# Patient Record
Sex: Female | Born: 1960 | Race: Black or African American | Hispanic: No | Marital: Single | Smoking: Never smoker
Health system: Southern US, Community
[De-identification: ages and names within clinical notes are randomized; demographics above are authoritative.]

## PROBLEM LIST (undated history)

## (undated) DIAGNOSIS — I10 Essential (primary) hypertension: Secondary | ICD-10-CM

## (undated) DIAGNOSIS — E119 Type 2 diabetes mellitus without complications: Secondary | ICD-10-CM

## (undated) HISTORY — PX: REPLACEMENT TOTAL KNEE: SUR1224

## (undated) HISTORY — PX: TUBAL LIGATION: SHX77

---

## 2016-03-07 ENCOUNTER — Emergency Department (HOSPITAL_COMMUNITY): Payer: Managed Care, Other (non HMO)

## 2016-03-07 ENCOUNTER — Observation Stay (HOSPITAL_COMMUNITY): Payer: Managed Care, Other (non HMO)

## 2016-03-07 ENCOUNTER — Encounter (HOSPITAL_COMMUNITY): Payer: Self-pay

## 2016-03-07 ENCOUNTER — Inpatient Hospital Stay (HOSPITAL_COMMUNITY)
Admission: EM | Admit: 2016-03-07 | Discharge: 2016-03-10 | DRG: 065 | Disposition: A | Discharge status: Home / Self Care | Payer: Managed Care, Other (non HMO) | Attending: Internal Medicine | Admitting: Internal Medicine

## 2016-03-07 DIAGNOSIS — R29703 NIHSS score 3: Secondary | ICD-10-CM | POA: Diagnosis present

## 2016-03-07 DIAGNOSIS — I10 Essential (primary) hypertension: Secondary | ICD-10-CM | POA: Diagnosis not present

## 2016-03-07 DIAGNOSIS — D333 Benign neoplasm of cranial nerves: Secondary | ICD-10-CM | POA: Diagnosis present

## 2016-03-07 DIAGNOSIS — H538 Other visual disturbances: Secondary | ICD-10-CM | POA: Diagnosis present

## 2016-03-07 DIAGNOSIS — E785 Hyperlipidemia, unspecified: Secondary | ICD-10-CM | POA: Diagnosis present

## 2016-03-07 DIAGNOSIS — Z96653 Presence of artificial knee joint, bilateral: Secondary | ICD-10-CM | POA: Diagnosis present

## 2016-03-07 DIAGNOSIS — E119 Type 2 diabetes mellitus without complications: Secondary | ICD-10-CM | POA: Diagnosis present

## 2016-03-07 DIAGNOSIS — R740 Nonspecific elevation of levels of transaminase and lactic acid dehydrogenase [LDH]: Secondary | ICD-10-CM

## 2016-03-07 DIAGNOSIS — I639 Cerebral infarction, unspecified: Secondary | ICD-10-CM | POA: Diagnosis present

## 2016-03-07 DIAGNOSIS — I63432 Cerebral infarction due to embolism of left posterior cerebral artery: Secondary | ICD-10-CM | POA: Diagnosis not present

## 2016-03-07 DIAGNOSIS — Q211 Atrial septal defect: Secondary | ICD-10-CM

## 2016-03-07 DIAGNOSIS — Z79899 Other long term (current) drug therapy: Secondary | ICD-10-CM

## 2016-03-07 DIAGNOSIS — R202 Paresthesia of skin: Secondary | ICD-10-CM | POA: Diagnosis not present

## 2016-03-07 DIAGNOSIS — Z6841 Body Mass Index (BMI) 40.0 and over, adult: Secondary | ICD-10-CM

## 2016-03-07 DIAGNOSIS — Z880 Allergy status to penicillin: Secondary | ICD-10-CM

## 2016-03-07 DIAGNOSIS — R7401 Elevation of levels of liver transaminase levels: Secondary | ICD-10-CM | POA: Diagnosis present

## 2016-03-07 DIAGNOSIS — Z9884 Bariatric surgery status: Secondary | ICD-10-CM

## 2016-03-07 DIAGNOSIS — Z885 Allergy status to narcotic agent status: Secondary | ICD-10-CM

## 2016-03-07 DIAGNOSIS — R4781 Slurred speech: Secondary | ICD-10-CM | POA: Diagnosis present

## 2016-03-07 DIAGNOSIS — R200 Anesthesia of skin: Secondary | ICD-10-CM | POA: Diagnosis present

## 2016-03-07 HISTORY — DX: Type 2 diabetes mellitus without complications: E11.9

## 2016-03-07 HISTORY — DX: Essential (primary) hypertension: I10

## 2016-03-07 LAB — DIFFERENTIAL
BASOS PCT: 0 %
Basophils Absolute: 0 10*3/uL (ref 0.0–0.1)
EOS ABS: 0.1 10*3/uL (ref 0.0–0.7)
EOS PCT: 1 %
Lymphocytes Relative: 18 %
Lymphs Abs: 1.4 10*3/uL (ref 0.7–4.0)
MONO ABS: 0.2 10*3/uL (ref 0.1–1.0)
MONOS PCT: 3 %
Neutro Abs: 6.1 10*3/uL (ref 1.7–7.7)
Neutrophils Relative %: 78 %

## 2016-03-07 LAB — RAPID URINE DRUG SCREEN, HOSP PERFORMED
Amphetamines: NOT DETECTED
BARBITURATES: NOT DETECTED
BENZODIAZEPINES: NOT DETECTED
Cocaine: NOT DETECTED
Opiates: NOT DETECTED
Tetrahydrocannabinol: NOT DETECTED

## 2016-03-07 LAB — URINALYSIS, ROUTINE W REFLEX MICROSCOPIC
Bilirubin Urine: NEGATIVE
Glucose, UA: >1000 mg/dL — AB
HGB URINE DIPSTICK: NEGATIVE
Ketones, ur: NEGATIVE mg/dL
Nitrite: NEGATIVE
PROTEIN: NEGATIVE mg/dL
Specific Gravity, Urine: 1.032 — ABNORMAL HIGH (ref 1.005–1.030)
pH: 5 (ref 5.0–8.0)

## 2016-03-07 LAB — I-STAT CHEM 8, ED
BUN: 12 mg/dL (ref 6–20)
CALCIUM ION: 1.19 mmol/L (ref 1.13–1.30)
CHLORIDE: 103 mmol/L (ref 101–111)
Creatinine, Ser: 1 mg/dL (ref 0.44–1.00)
GLUCOSE: 145 mg/dL — AB (ref 65–99)
HCT: 41 % (ref 36.0–46.0)
HEMOGLOBIN: 13.9 g/dL (ref 12.0–15.0)
POTASSIUM: 4.1 mmol/L (ref 3.5–5.1)
SODIUM: 143 mmol/L (ref 135–145)
TCO2: 26 mmol/L (ref 0–100)

## 2016-03-07 LAB — CBG MONITORING, ED: Glucose-Capillary: 169 mg/dL — ABNORMAL HIGH (ref 65–99)

## 2016-03-07 LAB — CBC
HEMATOCRIT: 39.5 % (ref 36.0–46.0)
Hemoglobin: 12 g/dL (ref 12.0–15.0)
MCH: 26.8 pg (ref 26.0–34.0)
MCHC: 30.4 g/dL (ref 30.0–36.0)
MCV: 88.2 fL (ref 78.0–100.0)
PLATELETS: 341 10*3/uL (ref 150–400)
RBC: 4.48 MIL/uL (ref 3.87–5.11)
RDW: 14.3 % (ref 11.5–15.5)
WBC: 7.8 10*3/uL (ref 4.0–10.5)

## 2016-03-07 LAB — COMPREHENSIVE METABOLIC PANEL
ALT: 56 U/L — ABNORMAL HIGH (ref 14–54)
AST: 51 U/L — ABNORMAL HIGH (ref 15–41)
Albumin: 3.7 g/dL (ref 3.5–5.0)
Alkaline Phosphatase: 75 U/L (ref 38–126)
Anion gap: 8 (ref 5–15)
BUN: 11 mg/dL (ref 6–20)
CO2: 27 mmol/L (ref 22–32)
Calcium: 9.5 mg/dL (ref 8.9–10.3)
Chloride: 106 mmol/L (ref 101–111)
Creatinine, Ser: 1.05 mg/dL — ABNORMAL HIGH (ref 0.44–1.00)
GFR calc Af Amer: >60 mL/min (ref >60)
GFR calc non Af Amer: 58 mL/min — ABNORMAL LOW (ref >60)
Glucose, Bld: 149 mg/dL — ABNORMAL HIGH (ref 65–99)
Potassium: 4 mmol/L (ref 3.5–5.1)
Sodium: 141 mmol/L (ref 135–145)
Total Bilirubin: 1 mg/dL (ref 0.3–1.2)
Total Protein: 6.6 g/dL (ref 6.5–8.1)

## 2016-03-07 LAB — PROTIME-INR
INR: 1.12 (ref 0.00–1.49)
PROTHROMBIN TIME: 14.6 s (ref 11.6–15.2)

## 2016-03-07 LAB — URINE MICROSCOPIC-ADD ON
Bacteria, UA: NONE SEEN
RBC / HPF: NONE SEEN RBC/hpf (ref 0–5)

## 2016-03-07 LAB — I-STAT TROPONIN, ED: Troponin i, poc: 0 ng/mL (ref 0.00–0.08)

## 2016-03-07 LAB — ETHANOL

## 2016-03-07 LAB — APTT: aPTT: 24 seconds (ref 24–37)

## 2016-03-07 MED ORDER — INSULIN ASPART 100 UNIT/ML ~~LOC~~ SOLN
0.0000 [IU] | Freq: Three times a day (TID) | SUBCUTANEOUS | Status: DC
  Administered 2016-03-08: 3 [IU] via SUBCUTANEOUS
  Administered 2016-03-08: 2 [IU] via SUBCUTANEOUS
  Administered 2016-03-08: 3 [IU] via SUBCUTANEOUS
  Administered 2016-03-09 – 2016-03-10 (×2): 2 [IU] via SUBCUTANEOUS
  Administered 2016-03-10: 3 [IU] via SUBCUTANEOUS

## 2016-03-07 MED ORDER — STROKE: EARLY STAGES OF RECOVERY BOOK
Freq: Once | Status: AC
  Administered 2016-03-08: 1
  Filled 2016-03-07: qty 1

## 2016-03-07 MED ORDER — VITAMIN B-12 1000 MCG PO TABS
1000.0000 ug | ORAL_TABLET | Freq: Every day | ORAL | Status: DC
  Administered 2016-03-08 – 2016-03-10 (×3): 1000 ug via ORAL
  Filled 2016-03-07 (×3): qty 1

## 2016-03-07 MED ORDER — VITAMIN A & D 8000-400 UNITS PO CAPS: 1.0000 | ORAL_CAPSULE | Freq: Every day | ORAL | Status: DC

## 2016-03-07 MED ORDER — ASPIRIN 325 MG PO TABS
325.0000 mg | ORAL_TABLET | Freq: Every day | ORAL | Status: DC
  Administered 2016-03-08 – 2016-03-10 (×3): 325 mg via ORAL
  Filled 2016-03-07 (×3): qty 1

## 2016-03-07 MED ORDER — ASPIRIN 300 MG RE SUPP
300.0000 mg | Freq: Every day | RECTAL | Status: DC
Start: 2016-03-08 — End: 2016-03-10

## 2016-03-07 MED ORDER — SENNOSIDES-DOCUSATE SODIUM 8.6-50 MG PO TABS: 1.0000 | ORAL_TABLET | Freq: Every evening | ORAL | Status: DC | PRN

## 2016-03-07 MED ORDER — ENOXAPARIN SODIUM 40 MG/0.4ML ~~LOC~~ SOLN
40.0000 mg | Freq: Every day | SUBCUTANEOUS | Status: DC
  Administered 2016-03-08 – 2016-03-10 (×2): 40 mg via SUBCUTANEOUS
  Filled 2016-03-07 (×2): qty 0.4

## 2016-03-07 MED ORDER — ADULT MULTIVITAMIN W/MINERALS CH
1.0000 | ORAL_TABLET | Freq: Every day | ORAL | Status: DC
  Administered 2016-03-08 – 2016-03-10 (×3): 1 via ORAL
  Filled 2016-03-07 (×3): qty 1

## 2016-03-07 MED ORDER — SODIUM CHLORIDE 0.9 % IV SOLN
INTRAVENOUS | Status: DC
  Administered 2016-03-08: 1000 mL via INTRAVENOUS
  Administered 2016-03-09: 500 mL via INTRAVENOUS

## 2016-03-07 MED ORDER — CALCIUM CARBONATE 1250 (500 CA) MG PO TABS
1250.0000 mg | ORAL_TABLET | Freq: Every day | ORAL | Status: DC
  Administered 2016-03-08 – 2016-03-10 (×3): 1250 mg via ORAL
  Filled 2016-03-07 (×3): qty 1

## 2016-03-07 NOTE — ED Notes (Signed)
Patient is stable A&Ox4.  Ready to be transported to 71[facility] at this time.

## 2016-03-07 NOTE — ED Notes (Signed)
Patient drove from [place] overnight for a funeral this afternoon.  Started feeling numbness and tingling around end of funeral. Unknown time.  Patient states that she has some blurred vision to the right side.  Denies any falling but cannot remember times.  Patient states she is diabetic and took her medications and has not eaten all day.  Patient A&Ox4

## 2016-03-07 NOTE — ED Provider Notes (Signed)
CSN: [id number]     Arrival date & time 03/07/16  1728 History   First MD Initiated Contact with Patient 03/07/16 1738     Chief Complaint  Patient presents with  . Numbness    right sided     (Consider location/radiation/quality/duration/timing/severity/associated sxs/prior Treatment) HPI  Pt presenting with c/o blurry vision, slurred speech, tingling of the right side of her face.  Family also state she seemed slow in her responses.  No headache.  She states symptoms started at 2:30pm when she was at a funeral. No weakness of arms or legs.  She states the symptoms have resolved at this time, vision is better, speech back to normal.  She drove last night from [place]- did not get very much sleep, attended funeral today.  Her blood sugar was checked during the symptoms and was 140s.  EMS called and patient brought to the ED for further evaluation and management.  There are no other associated systemic symptoms, there are no other alleviating or modifying factors.   Past Medical History  Diagnosis Date  . Diabetes mellitus without complication ([place])   . Hypertension    History reviewed. No pertinent past surgical history. History reviewed. No pertinent family history. Social History  Substance Use Topics  . Smoking status: Never Smoker   . Smokeless tobacco: None  . Alcohol Use: Yes   OB History    No data available     Review of Systems  ROS reviewed and all otherwise negative except for mentioned in HPI    Allergies  Codeine; Penicillins; and Percocet  Home Medications   Prior to Admission medications   Medication Sig Start Date End Date Taking? Authorizing Provider  CALCIUM PO Take 1 tablet by mouth daily.   Yes Historical Provider, MD  Multiple Vitamins-Minerals (MULTIVITAMIN ADULTS 50+) TABS Take 1 tablet by mouth daily.   Yes Historical Provider, MD  vitamin B-12 (CYANOCOBALAMIN) 1000 MCG tablet Take 1,000 mcg by mouth daily.   Yes Historical Provider, MD   Vitamins A & D (VITAMIN A & D PO) Take 1 tablet by mouth daily.   Yes Historical Provider, MD   BP 125/78 mmHg  Pulse 81  Temp(Src) 98.9 F (37.2 C)  Resp 19  SpO2 96%  Vitals reviewed Physical Exam  Physical Examination: General appearance - alert, well appearing, and in no distress Mental status - alert, oriented to person, place, and time Eyes - pupils equal and reactive, extraocular eye movements intact Mouth - mucous membranes moist, pharynx normal without lesions Neck - supple, no significant adenopathy Chest - clear to auscultation, no wheezes, rales or rhonchi, symmetric air entry Heart - normal rate, regular rhythm, normal S1, S2, no murmurs, rubs, clicks or gallops Abdomen - soft, nontender, nondistended, no masses or organomegaly Neurological - alert, oriented x 3, cranial nerves 2-12 tested and intact, strength 5/5 in extremities x 4, sensation intact Extremities - peripheral pulses normal, no pedal edema, no clubbing or cyanosis Skin - normal coloration and turgor, no rashes  ED Course  Procedures (including critical care time) Labs Review Labs Reviewed  COMPREHENSIVE METABOLIC PANEL - Abnormal; Notable for the following:    Glucose, Bld 149 (*)    Creatinine, Ser 1.05 (*)    AST 51 (*)    ALT 56 (*)    GFR calc non Af Amer 58 (*)    All other components within normal limits  URINALYSIS, ROUTINE W REFLEX MICROSCOPIC (NOT AT [facility]) - Abnormal; Notable for the following:  Specific Gravity, Urine 1.032 (*)    Glucose, UA >1000 (*)    Leukocytes, UA SMALL (*)    All other components within normal limits  URINE MICROSCOPIC-ADD ON - Abnormal; Notable for the following:    Squamous Epithelial / LPF 0-5 (*)    All other components within normal limits  CBG MONITORING, ED - Abnormal; Notable for the following:    Glucose-Capillary 169 (*)    All other components within normal limits  I-STAT CHEM 8, ED - Abnormal; Notable for the following:    Glucose, Bld 145 (*)     All other components within normal limits  ETHANOL  PROTIME-INR  APTT  CBC  DIFFERENTIAL  URINE RAPID DRUG SCREEN, HOSP PERFORMED  HEMOGLOBIN A1C  LIPID PANEL  I-STAT TROPOININ, ED    Imaging Review No results found. I have personally reviewed and evaluated these images and lab results as part of my medical decision-making.   EKG Interpretation   Date/Time:  Saturday March 07 2016 17:34:17 EDT Ventricular Rate:  84 PR Interval:    QRS Duration: 92 QT Interval:  381 QTC Calculation: 451 R Axis:     Text Interpretation:  Sinus rhythm Low voltage, precordial leads Abnormal  R-wave progression, early transition Borderline T abnormalities, diffuse  leads No old tracing to compare Confirmed by [facility]  MD, Aibhlinn Kalmar [phone]) on  03/07/2016 5:42:39 PM      MDM   Final diagnoses:  Cerebrovascular accident (CVA), unspecified mechanism ([place])    Pt presenting with c/o changes in vision, slurred speech- symptoms have resolved at time of ED evaluation so no indication for TPA.  Blood sugar was reassuring.  D/w radiology - concern for possible left occipital ischemia- d/w neurology as noted below.  Pt admitted to [facility], neurology consult.    9:04 PM d/w neurology- they have reviewed CT scan, will consult.  Recommend medical admission and stroke workup.  10:00 PM d/w Dr. Netta Neat for admission to [facility], pt will go to telemetry, obs.     Alfonzo Beers, MD 03/07/16 [phone]

## 2016-03-07 NOTE — H&P (Signed)
History and Physical    Amy Joseph [id number] DOB: 01-07-1961 DOA: 03/07/2016  PCP: Pcp Not In System   Patient coming from: Home   Chief Complaint: Right face numbness, slurred speech, blurred vision   HPI: Amy Joseph is a 55 y.o. female with medical history significant for hypertension, diabetes mellitus, and obesity who presents the emergency department for evaluation of right-sided facial paresthesias, slurred speech, and blurred vision involving the right eye. Patient reports that she drove to [place] from the [place] area overnight last night to attend a funeral here today and did not get much sleep at all. She reports being in her usual state of health when attending the funeral, but towards the end, around 2:30 PM, she noted numbness and tingling in the right side of her face and blurred vision involving the right eye. Her family was present at the bedside and notes that there was some slurred speech and her responses seem to be slowed during this episode. EMS was activated for transport to the hospital and all deficits had results completely by time of her arrival. Patient notes that she has had mild headaches for the past month or so which she hasn't really thought much of, and is also experienced hearing deficit in her right ear for the past month which she had initially attributed to earwax, but persisted following removal of the cerumen. She reports being scheduled for outpatient MRI back in [place] in order to evaluate her right-sided hearing deficits further. She denies any recent fevers, chills, dyspnea, or cough. She also denies chest pain, palpitations, or leg swelling. She denies abdominal pain, nausea, vomiting, or diarrhea. She denies any significant alcohol use and denies use of illicit substances. There's been no recent changes in her medications. She has never experienced similar symptoms previously.  ED Course: Upon arrival to the ED, patient is found to be  afebrile, saturating in the 90s on room air, and with vital signs otherwise stable. EKG demonstrates a sinus rhythm with low voltage QRS and early R transition. CMP features a mild elevation in the transaminases and CBC is unremarkable. Ethanol level is undetectable, UDS is completely negative, troponin is undetectable, INR is within the normal limits at 1.12, and urinalysis features greater than 1000 glucose and elevated specific gravity. Noncontrast CT of the head was obtained, and there has been no read entered into the EMR, but the radiologist has indicated to the ED physician that there is a concern for possible ischemia involving the left occipital lobe. Neurology was consulted by the ED physician and advises. The patient into the hospital for ongoing evaluation and management of suspected acute ischemic CVA.  Review of Systems:  All other systems reviewed and apart from HPI, are negative.  Past Medical History  Diagnosis Date  . Diabetes mellitus without complication ([place])   . Hypertension     History reviewed. No pertinent past surgical history.   reports that she has never smoked. She does not have any smokeless tobacco history on file. She reports that she drinks alcohol. Her drug history is not on file.  Allergies  Allergen Reactions  . Codeine Itching and Rash  . Penicillins Itching and Rash    Has patient had a PCN reaction causing immediate rash, facial/tongue/throat swelling, SOB or lightheadedness with hypotension: Yes Has patient had a PCN reaction causing severe rash involving mucus membranes or skin necrosis: No Has patient had a PCN reaction that required hospitalization No Has patient had a PCN reaction occurring within  the last 10 years: No If all of the above answers are "NO", then may proceed with Cephalosporin use.   Marland Kitchen Percocet [Oxycodone-Acetaminophen] Itching and Rash    History reviewed. No pertinent family history.   Prior to Admission medications     Medication Sig Start Date End Date Taking? Authorizing Provider  CALCIUM PO Take 1 tablet by mouth daily.   Yes Historical Provider, MD  Multiple Vitamins-Minerals (MULTIVITAMIN ADULTS 50+) TABS Take 1 tablet by mouth daily.   Yes Historical Provider, MD  vitamin B-12 (CYANOCOBALAMIN) 1000 MCG tablet Take 1,000 mcg by mouth daily.   Yes Historical Provider, MD  Vitamins A & D (VITAMIN A & D PO) Take 1 tablet by mouth daily.   Yes Historical Provider, MD    Physical Exam: Filed Vitals:   03/07/16 1931 03/07/16 1945 03/07/16 2000 03/07/16 2208  BP:  138/81 136/71 119/96  Pulse:  82 81 72  Temp: 98.9 F (37.2 C)     Resp:  14 19   SpO2:  100% 99% 98%      Constitutional: NAD, calm, comfortable, obese  Eyes: PERTLA, lids and conjunctivae normal ENMT: Mucous membranes are moist. Posterior pharynx clear of any exudate or lesions.   Neck: normal, supple, no masses, no thyromegaly Respiratory: clear to auscultation bilaterally, no wheezing, no crackles. Normal respiratory effort.   Cardiovascular: S1 & S2 heard, regular rate and rhythm, no significant murmurs. No carotid bruits. No significant JVD. Abdomen: No distension, no tenderness, no masses palpated. Bowel sounds normal.  Musculoskeletal: no clubbing / cyanosis. No joint deformity upper and lower extremities. Normal muscle tone.  Skin: no significant rashes, lesions, ulcers. Warm, dry, well-perfused. Neurologic: CN 2-12 grossly intact. Sensation intact, DTR normal. Strength 5/5 in all 4 limbs.  Psychiatric: Normal judgment and insight. Alert and oriented x 3. A little odd in her perseveration over diet orders and apparent apathy towards suspected stroke.     Labs on Admission: I have personally reviewed following labs and imaging studies  CBC:  Recent Labs Lab 03/07/16 1815 03/07/16 1838  WBC 7.8  --   NEUTROABS 6.1  --   HGB 12.0 13.9  HCT 39.5 41.0  MCV 88.2  --   PLT 341  --    Basic Metabolic Panel:  Recent  Labs Lab 03/07/16 1815 03/07/16 1838  NA 141 143  K 4.0 4.1  CL 106 103  CO2 27  --   GLUCOSE 149* 145*  BUN 11 12  CREATININE 1.05* 1.00  CALCIUM 9.5  --    GFR: CrCl cannot be calculated (Unknown ideal weight.). Liver Function Tests:  Recent Labs Lab 03/07/16 1815  AST 51*  ALT 56*  ALKPHOS 75  BILITOT 1.0  PROT 6.6  ALBUMIN 3.7   No results for input(s): LIPASE, AMYLASE in the last 168 hours. No results for input(s): AMMONIA in the last 168 hours. Coagulation Profile:  Recent Labs Lab 03/07/16 1815  INR 1.12   Cardiac Enzymes: No results for input(s): CKTOTAL, CKMB, CKMBINDEX, TROPONINI in the last 168 hours. BNP (last 3 results) No results for input(s): PROBNP in the last 8760 hours. HbA1C: No results for input(s): HGBA1C in the last 72 hours. CBG:  Recent Labs Lab 03/07/16 1738  GLUCAP 169*   Lipid Profile: No results for input(s): CHOL, HDL, LDLCALC, TRIG, CHOLHDL, LDLDIRECT in the last 72 hours. Thyroid Function Tests: No results for input(s): TSH, T4TOTAL, FREET4, T3FREE, THYROIDAB in the last 72 hours. Anemia Panel: No results  for input(s): VITAMINB12, FOLATE, FERRITIN, TIBC, IRON, RETICCTPCT in the last 72 hours. Urine analysis:    Component Value Date/Time   COLORURINE YELLOW 03/07/2016 1815   APPEARANCEUR CLEAR 03/07/2016 1815   LABSPEC 1.032* 03/07/2016 1815   PHURINE 5.0 03/07/2016 1815   GLUCOSEU >1000* 03/07/2016 1815   HGBUR NEGATIVE 03/07/2016 1815   BILIRUBINUR NEGATIVE 03/07/2016 1815   KETONESUR NEGATIVE 03/07/2016 1815   PROTEINUR NEGATIVE 03/07/2016 1815   NITRITE NEGATIVE 03/07/2016 1815   LEUKOCYTESUR SMALL* 03/07/2016 1815   Sepsis Labs: @LABRCNTIP (procalcitonin:4,lacticidven:4) )No results found for this or any previous visit (from the past 240 hour(s)).   Radiological Exams on Admission: No results found.  EKG: Independently reviewed. Sinus rhythm, low-voltage QRS, early R transition  Assessment/Plan  1.  Right face paresthesia, blurred vision right eye, ?acute ischemic CVA  - Sxs started ~2:30 pm and resolved spontaneously by time of admission  - CT head does not have read in EMR, but radiologist has indicated there is concern for ischemia in left occipital lobe  - Neurology consulting and much appreciated  - No tPA as sxs have apparently resolved  - MRI/MRA brain, carotid dopplers, TTE ordered and pending  - Monitor on telemetry; check fasting lipids and A1c in the am; - NPO until passes bedside swallow screen  - PT, OT, SLP evals deferred given lack of residual symptoms  - Secondary ppx with ASA  - VTE ppx  - Control RF's; maintain normothermia, euglycemia, euvolemia  - Permissive HTN in acute phase  2. Hypertension  - At goal currently  - Has documented hx of such, but not on any medications  - Monitor, allow HTN to 220/110 in acute phase    3. Type II DM  - No A1c on file - >1k urine glucose on admission, serum glucose 150  - Check CBG with meals and qHS - Start with moderate-intensity SSI correctional, adjust prn   - A1c ordered and pending   4. 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

## 2016-03-08 ENCOUNTER — Observation Stay (HOSPITAL_COMMUNITY): Payer: Managed Care, Other (non HMO)

## 2016-03-08 DIAGNOSIS — I63432 Cerebral infarction due to embolism of left posterior cerebral artery: Secondary | ICD-10-CM | POA: Diagnosis present

## 2016-03-08 DIAGNOSIS — I10 Essential (primary) hypertension: Secondary | ICD-10-CM | POA: Diagnosis present

## 2016-03-08 DIAGNOSIS — I6310 Cerebral infarction due to embolism of unspecified precerebral artery: Secondary | ICD-10-CM

## 2016-03-08 DIAGNOSIS — E119 Type 2 diabetes mellitus without complications: Secondary | ICD-10-CM | POA: Diagnosis present

## 2016-03-08 DIAGNOSIS — Z96653 Presence of artificial knee joint, bilateral: Secondary | ICD-10-CM | POA: Diagnosis present

## 2016-03-08 DIAGNOSIS — Z885 Allergy status to narcotic agent status: Secondary | ICD-10-CM | POA: Diagnosis not present

## 2016-03-08 DIAGNOSIS — Z9884 Bariatric surgery status: Secondary | ICD-10-CM | POA: Diagnosis not present

## 2016-03-08 DIAGNOSIS — R4781 Slurred speech: Secondary | ICD-10-CM | POA: Diagnosis present

## 2016-03-08 DIAGNOSIS — R202 Paresthesia of skin: Secondary | ICD-10-CM | POA: Diagnosis not present

## 2016-03-08 DIAGNOSIS — Z6841 Body Mass Index (BMI) 40.0 and over, adult: Secondary | ICD-10-CM | POA: Diagnosis not present

## 2016-03-08 DIAGNOSIS — R29703 NIHSS score 3: Secondary | ICD-10-CM | POA: Diagnosis present

## 2016-03-08 DIAGNOSIS — H538 Other visual disturbances: Secondary | ICD-10-CM | POA: Diagnosis present

## 2016-03-08 DIAGNOSIS — I63332 Cerebral infarction due to thrombosis of left posterior cerebral artery: Secondary | ICD-10-CM | POA: Diagnosis not present

## 2016-03-08 DIAGNOSIS — Z79899 Other long term (current) drug therapy: Secondary | ICD-10-CM | POA: Diagnosis not present

## 2016-03-08 DIAGNOSIS — I639 Cerebral infarction, unspecified: Secondary | ICD-10-CM | POA: Diagnosis present

## 2016-03-08 DIAGNOSIS — Z880 Allergy status to penicillin: Secondary | ICD-10-CM | POA: Diagnosis not present

## 2016-03-08 DIAGNOSIS — Q211 Atrial septal defect: Secondary | ICD-10-CM | POA: Diagnosis not present

## 2016-03-08 DIAGNOSIS — E785 Hyperlipidemia, unspecified: Secondary | ICD-10-CM | POA: Diagnosis present

## 2016-03-08 DIAGNOSIS — I340 Nonrheumatic mitral (valve) insufficiency: Secondary | ICD-10-CM | POA: Diagnosis not present

## 2016-03-08 DIAGNOSIS — D333 Benign neoplasm of cranial nerves: Secondary | ICD-10-CM | POA: Diagnosis present

## 2016-03-08 DIAGNOSIS — I6789 Other cerebrovascular disease: Secondary | ICD-10-CM | POA: Diagnosis not present

## 2016-03-08 LAB — GLUCOSE, CAPILLARY
GLUCOSE-CAPILLARY: 138 mg/dL — AB (ref 65–99)
GLUCOSE-CAPILLARY: 182 mg/dL — AB (ref 65–99)
Glucose-Capillary: 181 mg/dL — ABNORMAL HIGH (ref 65–99)

## 2016-03-08 LAB — LIPID PANEL
Cholesterol: 134 mg/dL (ref 0–200)
HDL: 43 mg/dL (ref >40)
LDL CALC: 76 mg/dL (ref 0–99)
Total CHOL/HDL Ratio: 3.1 RATIO
Triglycerides: 77 mg/dL (ref <150)
VLDL: 15 mg/dL (ref 0–40)

## 2016-03-08 LAB — ANTITHROMBIN III: AntiThromb III Func: 112 % (ref 75–120)

## 2016-03-08 MED ORDER — ATORVASTATIN CALCIUM 10 MG PO TABS
20.0000 mg | ORAL_TABLET | Freq: Every day | ORAL | Status: DC
  Administered 2016-03-08 – 2016-03-09 (×2): 20 mg via ORAL
  Filled 2016-03-08 (×2): qty 2

## 2016-03-08 MED ORDER — ACETAMINOPHEN 325 MG PO TABS
650.0000 mg | ORAL_TABLET | Freq: Four times a day (QID) | ORAL | Status: DC | PRN
  Administered 2016-03-08: 650 mg via ORAL
  Filled 2016-03-08: qty 2

## 2016-03-08 NOTE — Progress Notes (Signed)
VASCULAR LAB PRELIMINARY  PRELIMINARY  PRELIMINARY  PRELIMINARY  Carotid duplex completed.    Preliminary report:  1-39% ICA plaquing.  Vertebral artery flow is antegrade.   Klea Nall, RVT 03/08/2016, 9:29 AM

## 2016-03-08 NOTE — Consult Note (Signed)
Neurology Consult Note  Reason for Consultation: Possible stroke on CT  Requesting provider: Alfonzo Beers, MD  CC: vision changes, slurred speech  HPI: This is a 89 right-handed woman who presents to the muscular emergency department for evaluation after developing vision changes, slurred speech, and tingling in the right face. The symptoms started approximately 1430 on the afternoon of 03/07/16. She was attending a funeral when she first noticed symptoms. She describes feeling as if she was seeing 2 images offset by one another by just a little bit. She also feels as if the vision in her right eye was blurry. At the same time, she felt that she had some tingling on the right side of her face. Family members are present at the bedside and they noted that she seemed confused with inability to recall her name or age. The patient denies any frank vision loss, weakness, or headache associated with these symptoms. She has no history of previous similar symptoms. She states that her symptoms largely resolved prior to the arrival in the emergency department this evening, though she still states that she feels "off."  She attributes her symptoms to fatigue. She states that she drove to [place] from [place], [place] after she got off work yesterday evening. As a result, she got very little sleep. She endorses some low-grade headaches that are nonspecific in nature. 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  PMH:  1. Diabetes mellitus 2. Hypertension 3. History of Chiari malformation 4. History of morbid obesity  PSH:  1. Bilateral knee replacement surgery  2. Chiari decompression 3. Gastric bypass with revision  Family history: She denies any major health problems in the family.  Social history:  Social History   Social History  . Marital status: Single    Spouse name: N/A   . Number of children: N/A  . Years of education: N/A   Occupational History  . Not on file.   Social History Main Topics  . Smoking status: Never Smoker  . Smokeless tobacco: Not on file  . Alcohol use Yes  . Drug use: Unknown  . Sexual activity: Not Currently   Other Topics Concern  . Not on file   Social History Narrative  . No narrative on file    Current outpatient meds: Current Meds  Medication Sig  . CALCIUM PO Take 1 tablet by mouth daily.  . Multiple Vitamins-Minerals (MULTIVITAMIN ADULTS 50+) TABS Take 1 tablet by mouth daily.  . vitamin B-12 (CYANOCOBALAMIN) 1000 MCG tablet Take 1,000 mcg by mouth daily.  . Vitamins A & D (VITAMIN A & D PO) Take 1 tablet by mouth daily.    Current inpatient meds:  Current Facility-Administered Medications  Medication Dose Route Frequency Provider Last Rate Last Dose  . 0.9 %  sodium chloride infusion   Intravenous Continuous Ilene Qua Opyd, MD 10 mL/hr at 03/08/16 0006 1,000 mL at 03/08/16 0006  . aspirin suppository 300 mg  300 mg Rectal Daily Vianne Bulls, MD       Or  . aspirin tablet 325 mg  325 mg Oral Daily Ilene Qua Opyd, MD      . calcium carbonate (OS-CAL - dosed in mg of elemental calcium) tablet 1,250 mg  1,250 mg Oral Q breakfast Timothy S Opyd, MD      . enoxaparin (LOVENOX) injection 40 mg  40 mg  Subcutaneous Daily Timothy S Opyd, MD      . insulin aspart (novoLOG) injection 0-15 Units  0-15 Units Subcutaneous TID WC Ilene Qua Opyd, MD      . multivitamin with minerals tablet 1 tablet  1 tablet Oral Daily Timothy S Opyd, MD      . senna-docusate (Senokot-S) tablet 1 tablet  1 tablet Oral QHS PRN Vianne Bulls, MD      . vitamin B-12 (CYANOCOBALAMIN) tablet 1,000 mcg  1,000 mcg Oral Daily Vianne Bulls, MD        Allergies: Allergies  Allergen Reactions  . Codeine Itching and Rash  . Penicillins Itching and Rash    Has patient had a PCN reaction causing immediate rash, facial/tongue/throat swelling, SOB or  lightheadedness with hypotension: Yes Has patient had a PCN reaction causing severe rash involving mucus membranes or skin necrosis: No Has patient had a PCN reaction that required hospitalization No Has patient had a PCN reaction occurring within the last 10 years: No If all of the above answers are "NO", then may proceed with Cephalosporin use.   Marland Kitchen Percocet [Oxycodone-Acetaminophen] Itching and Rash    ROS: As per HPI. A full 14-point review of systems was performed and is otherwise unremarkable.   PE:  BP 124/67 (BP Location: Left Arm)   Pulse 69   Temp 98.5 F (36.9 C) (Oral)   Resp 15   Wt 131.2 kg (289 lb 4.8 oz)   SpO2 98%   General: WD obese-American woman resting comfortably on the ED gurney. She is in no acute distress. AAO x4. Speech clear, no dysarthria. No aphasia. Follows commands briskly. Affect is flat and she seems indifferent to the fact that she is currently in the emergency department. Comportment is normal.  HEENT: Normocephalic. Neck supple without LAD. MMM, OP clear. Dentition good. Sclerae anicteric. No conjunctival injection.  CV: Regular, no murmur. Carotid pulses full and symmetric, no bruits. Distal pulses 2+ and symmetric.  Lungs: CTAB.  Abdomen: Soft, non-distended, non-tender. Bowel sounds present x4.  Extremities: No C/C/E. Neuro:  CN: Pupils are equal and round. They are symmetrically reactive from 3-->2 mm. EOMI without nystagmus. No reported diplopia. Facial sensation is intact to light touch. Face is symmetric at rest with normal strength and mobility. Hearing is intact to conversational voice. Palate elevates symmetrically and uvula is midline. Voice is normal in tone, pitch and quality. Bilateral SCM and trapezii are 5/5. Tongue is midline with normal bulk and mobility.  Motor: Normal bulk, tone, and strength. No tremor or other abnormal movements. No drift.  Sensation: Intact to light touch, pinprick, vibration. DTRs: 2+, symmetric. Toes downgoing  bilaterally. No pathologic reflexes.  Coordination: Finger-to-nose and heel-to-shin are without dysmetria. Finger taps are normal in amplitude and speed, no decrement.   Labs:  Lab Results  Component Value Date   WBC 7.8 03/07/2016   HGB 13.9 03/07/2016   HCT 41.0 03/07/2016   PLT 341 03/07/2016   GLUCOSE 145 (H) 03/07/2016   ALT 56 (H) 03/07/2016   AST 51 (H) 03/07/2016   NA 143 03/07/2016   K 4.1 03/07/2016   CL 103 03/07/2016   CREATININE 1.00 03/07/2016   BUN 12 03/07/2016   CO2 27 03/07/2016   INR 1.12 03/07/2016   Urinalysis notable for specific gravity 1.032, greater than 1000 mg/dL glucose, small leukocyte esterase, negative nitrites Urine drug screen negative Serum ethanol less than 5 mg/dL Troponin 0.00  Imaging: I have personally and independently reviewed  the CT scan of the head without contrast from 03/07/16. This shows patchy hypodensity in the left occipital region concerning for possible ischemic change, the age of which is indeterminate. There also appears to be some hyperdensity within the region of the left posterior cerebral artery.  Assessment and Plan:  1. Possible ischemic stroke: CT scan of the head is concerning for possible left occipital ischemic infarction. Her complaints are somewhat vague with reports of blurry vision in the right eye. She certainly has no hemianopsia on current examination but it's possible that the symptoms that she is referring to as blurry vision may have represented a field cut instead has individuals often have difficulty describing visual field abnormalities. She certainly has risk factors for cerebrovascular disease including diabetes, hypertension, hyperlipidemia, and obesity. She tells me that she does not take aspirin on a regular basis. At this point, I recommend stroke evaluation with MRI scan of the brain, MRA of the head, transthoracic echocardiogram, carotid Dopplers, hemoglobin A1c, and lipid panel. She should be initiated on  aspirin therapy with 81 mg daily. She would also benefit from initiation of a statin, typically atorvastatin 80 mg daily or its equivalent. Ensure tight control of her blood pressure, temperature, and glucose.  2. Slurred speech: This appears to have resolved and she has no evidence of speech abnormalities on her current examination. This can be followed.  3. Changes in vision: She alternately describes blurry vision and what sounds like maybe double vision. As noted above, I cannot exclude that she had a field cut particularly given the presence of abnormalities in the left occipital lobe on imaging. Examination is unrevealing at this time.  This was discussed with the patient and her family members at the bedside. They are in agreement with the plan as noted. There were given the opportunity to ask any questions which were addressed to their satisfaction.  The stroke team will follow up beginning with morning rounds on 7/to 3/17. Please feel free to call with any questions or concerns.

## 2016-03-08 NOTE — Progress Notes (Signed)
Patient arrived around 2330 alert and oriented no pain needing MRI, and lab work as well as ECHO SCD's on and Q 2 neuro checks and vitals started. Will continue to monitor.

## 2016-03-08 NOTE — Progress Notes (Signed)
Pt admitted after midnight, please see earlier admission note by Dr. Myna Hidalgo. Pt seen and examined at bedside, stable, blood work, VS and imaging studies reviewed with pt. Pt with new ? Embolic strokes. Stroke work up in progress. Neurology following.   Faye Ramsay, MD  [facility] Pager [phone]  If 7PM-7AM, please contact night-coverage [email or web address] Password [email or web address]

## 2016-03-08 NOTE — Progress Notes (Signed)
STROKE TEAM PROGRESS NOTE   HISTORY OF PRESENT ILLNESS (per record) This is a 23 right-handed woman who presents to the [facility] emergency department for evaluation after developing vision changes, slurred speech, and tingling in the right face. The symptoms started approximately 1430 on the afternoon of 03/07/16. She was attending a funeral when she first noticed symptoms. She describes feeling as if she was seeing 2 images offset by one another by just a little bit. She also feels as if the vision in her right eye was blurry. At the same time, she felt that she had some tingling on the right side of her face. Family members are present at the bedside and they noted that she seemed confused with inability to recall her name or age. The patient denies any frank vision loss, weakness, or headache associated with these symptoms. She has no history of previous similar symptoms. She states that her symptoms largely resolved prior to the arrival in the emergency department this evening, though she still states that she feels "off."  She attributes her symptoms to fatigue. She states that she drove to [place] from [place], [place] after she got off work yesterday evening. As a result, she got very little sleep. She endorses some low-grade headaches that are nonspecific in nature. 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   SUBJECTIVE (INTERVAL HISTORY) Currently she denies any numbness or visual deficits   OBJECTIVE Temp:  [97.7 F (36.5 C)-98.9 F (37.2 C)] 97.9 F (36.6 C) (07/23 0730) Pulse Rate:  [64-87] 67 (07/23 0730) Cardiac Rhythm: Normal sinus rhythm (07/23 0002) Resp:  [12-24] 16 (07/23 0730) BP: (104-152)/(55-97) 104/55 (07/23 0730) SpO2:  [95 %-100 %] 95 % (07/23 0730) Weight:  [131.2 kg (289 lb 4.8 oz)] 131.2 kg (289 lb 4.8 oz) (07/22 2330)  CBC:  Recent Labs Lab  03/07/16 1815 03/07/16 1838  WBC 7.8  --   NEUTROABS 6.1  --   HGB 12.0 13.9  HCT 39.5 41.0  MCV 88.2  --   PLT 341  --     Basic Metabolic Panel:  Recent Labs Lab 03/07/16 1815 03/07/16 1838  NA 141 143  K 4.0 4.1  CL 106 103  CO2 27  --   GLUCOSE 149* 145*  BUN 11 12  CREATININE 1.05* 1.00  CALCIUM 9.5  --     Lipid Panel:    Component Value Date/Time   CHOL 134 03/08/2016 0421   TRIG 77 03/08/2016 0421   HDL 43 03/08/2016 0421   CHOLHDL 3.1 03/08/2016 0421   VLDL 15 03/08/2016 0421   LDLCALC 76 03/08/2016 0421   HgbA1c: No results found for: HGBA1C Urine Drug Screen:    Component Value Date/Time   LABOPIA NONE DETECTED 03/07/2016 1815   COCAINSCRNUR NONE DETECTED 03/07/2016 1815   LABBENZ NONE DETECTED 03/07/2016 1815   AMPHETMU NONE DETECTED 03/07/2016 1815   THCU NONE DETECTED 03/07/2016 1815   LABBARB NONE DETECTED 03/07/2016 1815      IMAGING  Dg Chest 2 View Result Date: 03/07/2016 No acute finding.  Mild low volumes.   Ct Head Wo Contrast Result Date: 03/07/2016 High attenuation within the expected location of the left PCA artery, raising the possibility of thrombus. Additionally there is suggestion of possible early ischemic change within the left occipital lobe.     Mr Jodene Nam Head/brain [id number] Cm Result  Date: 03/08/2016  MRI HEAD:  Acute patchy LEFT posterior cerebral artery territory infarct (LEFT hippocampus and LEFT thalamus). Sub cm RIGHT cerebellar and LEFT occipital lobe acute infarcts. Status post suboccipital decompression for Chiari 1 malformation.   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      PHYSICAL EXAM  Neuro: Mental Status: Patient is awake, alert, oriented to person, place, month, year, and situation. Patient is able to give a clear and coherent history. No signs of aphasia or neglect Cranial Nerves: II: Visual Fields are full. Pupils are equal, round, and reactive to light.   III,IV,  VI: EOMI without ptosis or diploplia.  V: Facial sensation is symmetric to temperature VII: Facial movement is symmetric.  VIII: hearing is intact to voice X: Uvula elevates symmetrically XI: Shoulder shrug is symmetric. XII: tongue is midline without atrophy or fasciculations.  Motor: Tone is normal. Bulk is normal. 5/5 strength was present in all four extremities.  Sensory: Sensation is symmetric to light touch and temperature in the arms and legs. Deep Tendon Reflexes: 2+ and symmetric in the biceps and patellae.  Plantars: Toes are downgoing bilaterally.  Cerebellar: FNF and HKS are intact bilaterally         ASSESSMENT/PLAN Ms. Amy Joseph is a 55 y.o. female with history of hypertension, diabetes mellitus, and right sided acoustic neuroma presenting with transient visual difficulties, slurred speech, and tingling of the right face. She did not receive IV t-PA due to resolution of deficits.  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  Resultant  resolution of deficits  MRI - Acute patchy LEFT posterior cerebral artery territory infarct. Sub cm RIGHT cerebellar and LEFT  occipital lobe acute infarcts. Status post suboccipital decompression for Chiari 1 malformation  MRA - emergent LEFT posterior cerebral artery occlusion at P2 segment.    Carotid Doppler -  1-39% ICA plaquing.  Vertebral artery flow is antegrade.   2D Echo - pending  LDL - 76  HgbA1c pending  VTE prophylaxis - Lovenox  Diet Carb Modified Fluid consistency:: Thin; Room service appropriate?: Yes  No antithrombotic prior to admission, now on aspirin 325 mg daily  Patient counseled to be compliant with her antithrombotic medications  Ongoing aggressive stroke risk factor management  Therapy recommendations: Pending  Disposition: Pending  Hypertension  Stable  Permissive hypertension (OK if < 220/120) but gradually normalize in 5-7 days  Long-term BP goal  normotensive  Hyperlipidemia  Home meds:  No lipid lowering medications prior to admission  LDL 76, goal < 70  Now on Lipitor 20 mg daily  Continue statin at discharge  Diabetes  HgbA1c pending, goal < 7.0  Uncontrolled  Other Stroke Risk Factors  ETOH use, advised to drink no more than 1 - 2 drink(s) a day  Obesity, Body mass index is 51.25 kg/m., recommend weight loss, diet and exercise as appropriate    Other Active Problems   PLAN  Will schedule for TEE and possible loop on Monday  Z. Robyne Askew MD Neurology     To contact Stroke Continuity provider, please refer to [email or web address] After hours, contact General Neurology

## 2016-03-09 ENCOUNTER — Encounter (HOSPITAL_COMMUNITY)
Admission: EM | Disposition: A | Discharge status: Home / Self Care | Payer: Self-pay | Source: Home / Self Care | Attending: Internal Medicine

## 2016-03-09 ENCOUNTER — Inpatient Hospital Stay (HOSPITAL_COMMUNITY): Payer: Managed Care, Other (non HMO)

## 2016-03-09 ENCOUNTER — Encounter (HOSPITAL_COMMUNITY): Payer: Self-pay | Admitting: *Deleted

## 2016-03-09 DIAGNOSIS — I6789 Other cerebrovascular disease: Secondary | ICD-10-CM

## 2016-03-09 DIAGNOSIS — I340 Nonrheumatic mitral (valve) insufficiency: Secondary | ICD-10-CM

## 2016-03-09 DIAGNOSIS — I639 Cerebral infarction, unspecified: Secondary | ICD-10-CM

## 2016-03-09 DIAGNOSIS — I63332 Cerebral infarction due to thrombosis of left posterior cerebral artery: Secondary | ICD-10-CM

## 2016-03-09 HISTORY — PX: TEE WITHOUT CARDIOVERSION: SHX5443

## 2016-03-09 LAB — BASIC METABOLIC PANEL
ANION GAP: 6 (ref 5–15)
BUN: 10 mg/dL (ref 6–20)
CHLORIDE: 107 mmol/L (ref 101–111)
CO2: 28 mmol/L (ref 22–32)
Calcium: 9 mg/dL (ref 8.9–10.3)
Creatinine, Ser: 0.86 mg/dL (ref 0.44–1.00)
GFR calc Af Amer: >60 mL/min (ref >60)
GLUCOSE: 125 mg/dL — AB (ref 65–99)
POTASSIUM: 3.4 mmol/L — AB (ref 3.5–5.1)
Sodium: 141 mmol/L (ref 135–145)

## 2016-03-09 LAB — GLUCOSE, CAPILLARY
GLUCOSE-CAPILLARY: 149 mg/dL — AB (ref 65–99)
GLUCOSE-CAPILLARY: 133 mg/dL — AB (ref 65–99)
GLUCOSE-CAPILLARY: 211 mg/dL — AB (ref 65–99)
Glucose-Capillary: 137 mg/dL — ABNORMAL HIGH (ref 65–99)

## 2016-03-09 LAB — CARDIOLIPIN ANTIBODIES, IGG, IGM, IGA
Anticardiolipin IgA: <9 APL U/mL (ref 0–11)
Anticardiolipin IgM: <9 MPL U/mL (ref 0–12)

## 2016-03-09 LAB — CBC
HEMATOCRIT: 36.9 % (ref 36.0–46.0)
HEMOGLOBIN: 11.3 g/dL — AB (ref 12.0–15.0)
MCH: 26.9 pg (ref 26.0–34.0)
MCHC: 30.6 g/dL (ref 30.0–36.0)
MCV: 87.9 fL (ref 78.0–100.0)
PLATELETS: 309 10*3/uL (ref 150–400)
RBC: 4.2 MIL/uL (ref 3.87–5.11)
RDW: 14.6 % (ref 11.5–15.5)
WBC: 5.5 10*3/uL (ref 4.0–10.5)

## 2016-03-09 LAB — HOMOCYSTEINE: Homocysteine: 8.4 umol/L (ref 0.0–15.0)

## 2016-03-09 LAB — ECHOCARDIOGRAM COMPLETE
Height: 63 in
Weight: 4628.8 oz

## 2016-03-09 LAB — HEMOGLOBIN A1C
HEMOGLOBIN A1C: 7.3 % — AB (ref 4.8–5.6)
MEAN PLASMA GLUCOSE: 163 mg/dL

## 2016-03-09 SURGERY — ECHOCARDIOGRAM, TRANSESOPHAGEAL
Anesthesia: Moderate Sedation

## 2016-03-09 MED ORDER — SODIUM CHLORIDE 0.9 % IV SOLN: INTRAVENOUS | Status: DC

## 2016-03-09 MED ORDER — MIDAZOLAM HCL 10 MG/2ML IJ SOLN
INTRAMUSCULAR | Status: DC | PRN
  Administered 2016-03-09: 1 mg via INTRAVENOUS
  Administered 2016-03-09 (×2): 2 mg via INTRAVENOUS

## 2016-03-09 MED ORDER — FENTANYL CITRATE (PF) 100 MCG/2ML IJ SOLN
INTRAMUSCULAR | Status: AC
  Filled 2016-03-09: qty 2

## 2016-03-09 MED ORDER — LIDOCAINE VISCOUS 2 % MT SOLN
OROMUCOSAL | Status: AC
  Filled 2016-03-09: qty 15

## 2016-03-09 MED ORDER — MIDAZOLAM HCL 5 MG/ML IJ SOLN
INTRAMUSCULAR | Status: AC
  Filled 2016-03-09: qty 2

## 2016-03-09 MED ORDER — LIDOCAINE VISCOUS 2 % MT SOLN
OROMUCOSAL | Status: DC | PRN
  Administered 2016-03-09: 1 via OROMUCOSAL

## 2016-03-09 MED ORDER — DIPHENHYDRAMINE HCL 50 MG/ML IJ SOLN
INTRAMUSCULAR | Status: AC
  Filled 2016-03-09: qty 1

## 2016-03-09 MED ORDER — FENTANYL CITRATE (PF) 100 MCG/2ML IJ SOLN
INTRAMUSCULAR | Status: DC | PRN
  Administered 2016-03-09: 12.5 ug via INTRAVENOUS
  Administered 2016-03-09 (×2): 25 ug via INTRAVENOUS

## 2016-03-09 NOTE — Progress Notes (Signed)
  Echocardiogram Echocardiogram Transesophageal has been performed.  Amy Joseph 03/09/2016, 3:24 PM

## 2016-03-09 NOTE — Consult Note (Signed)
ELECTROPHYSIOLOGY CONSULT NOTE  Patient ID: Amy Joseph MRN: [id number], DOB/AGE: 1961/03/17   Admit date: 03/07/2016 Date of Consult: 03/09/2016  Primary Physician: Pcp Not In System Reason for Consultation: Cryptogenic stroke; recommendations regarding Implantable Loop Recorder  History of Present Illness Amy Joseph was admitted on 03/07/2016 with acute CVA. she has been monitored on telemetry which has demonstrated no arrhythmias. No cause has been identified. Inpatient stroke work-up is to be completed with a TEE. EP has been asked to evaluate for placement of an implantable loop recorder to monitor for atrial fibrillation.  Past Medical History Past Medical History:  Diagnosis Date  . Diabetes mellitus without complication ([place])   . Hypertension     Past Surgical History History reviewed. No pertinent surgical history.  Allergies/Intolerances Allergies  Allergen Reactions  . Codeine Itching and Rash  . Penicillins Itching and Rash    Has patient had a PCN reaction causing immediate rash, facial/tongue/throat swelling, SOB or lightheadedness with hypotension: Yes Has patient had a PCN reaction causing severe rash involving mucus membranes or skin necrosis: No Has patient had a PCN reaction that required hospitalization No Has patient had a PCN reaction occurring within the last 10 years: No If all of the above answers are "NO", then may proceed with Cephalosporin use.   Marland Kitchen Percocet [Oxycodone-Acetaminophen] Itching and Rash   Inpatient Medications . aspirin  300 mg Rectal Daily   Or  . aspirin  325 mg Oral Daily  . atorvastatin  20 mg Oral q1800  . calcium carbonate  1,250 mg Oral Q breakfast  . enoxaparin (LOVENOX) injection  40 mg Subcutaneous Daily  . insulin aspart  0-15 Units Subcutaneous TID WC  . multivitamin with minerals  1 tablet Oral Daily  . vitamin B-12  1,000 mcg Oral Daily   . sodium chloride Stopped (03/09/16 0000)   Social History Social  History   Social History  . Marital status: Single    Spouse name: N/A  . Number of children: N/A  . Years of education: N/A   Occupational History  . Not on file.   Social History Main Topics  . Smoking status: Never Smoker  . Smokeless tobacco: Not on file  . Alcohol use Yes  . Drug use: Unknown  . Sexual activity: Not Currently   Other Topics Concern  . Not on file   Social History Narrative  . No narrative on file    Review of Systems General: No chills, fever, night sweats or weight changes  Cardiovascular:  No chest pain, dyspnea on exertion, edema, orthopnea, palpitations, paroxysmal nocturnal dyspnea Dermatological: No rash, lesions or masses Respiratory: No cough, dyspnea Urologic: No hematuria, dysuria Abdominal: No nausea, vomiting, diarrhea, bright red blood per rectum, melena, or hematemesis Neurologic: No visual changes, weakness, changes in mental status All other systems reviewed and are otherwise negative except as noted above.  Physical Exam Blood pressure 125/63, pulse 71, temperature 97.9 F (36.6 C), temperature source Oral, resp. rate 16, height 5\' 3"  (1.6 m), weight 289 lb 4.8 oz (131.2 kg), SpO2 96 %.  General: Well developed, well appearing 55 y.o. female in no acute distress. HEENT: Normocephalic, atraumatic. EOMs intact. Sclera nonicteric. Oropharynx clear.  Neck: Supple without bruits. No JVD. Lungs: Respirations regular and unlabored, CTA bilaterally. No wheezes, rales or rhonchi. Heart: RRR. S1, S2 present. No murmurs, rub, S3 or S4. Abdomen: Soft, non-tender, non-distended. BS present x 4 quadrants. No hepatosplenomegaly.  Extremities: No clubbing, cyanosis or edema. DP/PT/Radials 2+  and equal bilaterally. Psych: Normal affect. Neuro: Alert and oriented X 3. Moves all extremities spontaneously. Musculoskeletal: No kyphosis. Skin: Intact. Warm and dry. No rashes or petechiae in exposed areas.   Labs Lab Results  Component Value Date    WBC 5.5 03/09/2016   HGB 11.3 (L) 03/09/2016   HCT 36.9 03/09/2016   MCV 87.9 03/09/2016   PLT 309 03/09/2016    Recent Labs Lab 03/07/16 1815  03/09/16 0310  NA 141  < > 141  K 4.0  < > 3.4*  CL 106  < > 107  CO2 27  --  28  BUN 11  < > 10  CREATININE 1.05*  < > 0.86  CALCIUM 9.5  --  9.0  PROT 6.6  --   --   BILITOT 1.0  --   --   ALKPHOS 75  --   --   ALT 56*  --   --   AST 51*  --   --   GLUCOSE 149*  < > 125*  < > = values in this interval not displayed.  Recent Labs  03/07/16 1815  INR 1.12    Radiology/Studies Dg Chest 2 View  Result Date: 03/07/2016 CLINICAL DATA:  CVA.  Blurred vision. EXAM: CHEST  2 VIEW COMPARISON:  None. FINDINGS: Increased density over the spine in the lateral projection is likely from bulky spondylosis. Low volume chest with interstitial crowding but no convincing pneumonia. No edema or effusion. Normal heart size and mediastinal contours. IMPRESSION: No acute finding.  Mild low volumes. Electronically Signed   By: Monte Fantasia M.D.   On: 03/07/2016 23:12   Ct Head Wo Contrast  Result Date: 03/07/2016 CLINICAL DATA:  Patient with numbness and tingling after long drive. Blurred vision. EXAM: CT HEAD WITHOUT CONTRAST TECHNIQUE: Contiguous axial images were obtained from the base of the skull through the vertex without intravenous contrast. COMPARISON:  None. FINDINGS: Ventricles and sulci are appropriate for patient's age. There is linear high attenuation within the expected location of the left PCA artery (image 12; series 2). Suggestion of possible hypodensity within the left occipital lobe raising the possibility of early ischemic change. No evidence for intracranial hemorrhage, mass or mass effect. Orbits are unremarkable. Paranasal sinuses are well aerated. Mastoid air cells are unremarkable. Suboccipital craniectomy. IMPRESSION: High attenuation within the expected location of the left PCA artery, raising the possibility of thrombus.  Additionally there is suggestion of possible early ischemic change within the left occipital lobe. These results were called by telephone at the time of interpretation on 03/07/2016 at 8:57 pm to Dr. Alfonzo Beers , who verbally acknowledged these results. Electronically Signed   By: Lovey Newcomer M.D.   On: 03/07/2016 21:00  Mr Brain Wo Contrast  Result Date: 03/08/2016 CLINICAL DATA:  Numbness and tingling after long drive, blurry vision. Follow-up possible stroke. EXAM: MRI HEAD WITHOUT CONTRAST MRA HEAD WITHOUT CONTRAST TECHNIQUE: Multiplanar, multiecho pulse sequences of the brain and surrounding structures were obtained without intravenous contrast. Angiographic images of the head were obtained using MRA technique without contrast. COMPARISON:  CT HEAD March 07, 2016 at 2033 hours FINDINGS: MRI HEAD FINDINGS INTRACRANIAL CONTENTS: Reduced diffusion LEFT hippocampus, LEFT thalamus with corresponding low ADC values. Subcentimeter foci of reduced diffusion RIGHT cerebellum, LEFT occipital lobe. No susceptibility artifact to suggest hemorrhage. The ventricles and sulci are normal for patient's age. Scattered subcentimeter supratentorial white matter FLAIR T2 hyperintensities exclusive aforementioned abnormality compatible with mild chronic small vessel ischemic  disease. No suspicious parenchymal signal, masses or mass effect. No abnormal extra-axial fluid collections. No extra-axial masses though, contrast enhanced sequences would be more sensitive. 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 ORBITS: The included ocular globes and orbital contents are non-suspicious. SINUSES: The mastoid air-cells and included paranasal sinuses are well-aerated. SKULL/SOFT TISSUES: No abnormal sellar expansion. No suspicious calvarial bone marrow signal. Craniocervical junction maintained. Status post suboccipital decompressive craniectomy. MRA HEAD FINDINGS- Mild motion  degraded examination. ANTERIOR CIRCULATION: Normal flow related enhancement of the included cervical, petrous, cavernous and supraclinoid internal carotid arteries. Patent anterior communicating artery. Normal flow related enhancement of the anterior and middle cerebral arteries, including distal segments. No large vessel occlusion, high-grade stenosis, abnormal luminal irregularity, aneurysm. POSTERIOR CIRCULATION: LEFT vertebral artery is dominant. Basilar artery is patent, with normal flow related enhancement of the main branch vessels. Tiny LEFT posterior communicating artery, occluded LEFT proximal P2 segment with minimal collateralization by MRI. No large vessel occlusion, high-grade stenosis, abnormal luminal irregularity, aneurysm. IMPRESSION: MRI HEAD: Acute patchy LEFT posterior cerebral artery territory infarct (LEFT hippocampus and LEFT thalamus). Sub cm RIGHT cerebellar and LEFT occipital lobe acute infarcts. Status post suboccipital decompression for Chiari 1 malformation. 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 These results will be called to the ordering clinician or representative by the Radiologist Assistant, and communication documented in the PACS or zVision Dashboard. Electronically Signed   By: Elon Alas M.D.   On: 03/08/2016 05:21  Mr Jodene Nam Head/brain [id number] Cm  Result Date: 03/08/2016 CLINICAL DATA:  Numbness and tingling after long drive, blurry vision. Follow-up possible stroke. EXAM: MRI HEAD WITHOUT CONTRAST MRA HEAD WITHOUT CONTRAST TECHNIQUE: Multiplanar, multiecho pulse sequences of the brain and surrounding structures were obtained without intravenous contrast. Angiographic images of the head were obtained using MRA technique without contrast. COMPARISON:  CT HEAD March 07, 2016 at 2033 hours FINDINGS: MRI HEAD FINDINGS INTRACRANIAL CONTENTS: Reduced diffusion LEFT hippocampus, LEFT thalamus with corresponding low ADC values. Subcentimeter foci  of reduced diffusion RIGHT cerebellum, LEFT occipital lobe. No susceptibility artifact to suggest hemorrhage. The ventricles and sulci are normal for patient's age. Scattered subcentimeter supratentorial white matter FLAIR T2 hyperintensities exclusive aforementioned abnormality compatible with mild chronic small vessel ischemic disease. No suspicious parenchymal signal, masses or mass effect. No abnormal extra-axial fluid collections. No extra-axial masses though, contrast enhanced sequences would be more sensitive. 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 ORBITS: The included ocular globes and orbital contents are non-suspicious. SINUSES: The mastoid air-cells and included paranasal sinuses are well-aerated. SKULL/SOFT TISSUES: No abnormal sellar expansion. No suspicious calvarial bone marrow signal. Craniocervical junction maintained. Status post suboccipital decompressive craniectomy. MRA HEAD FINDINGS- Mild motion degraded examination. ANTERIOR CIRCULATION: Normal flow related enhancement of the included cervical, petrous, cavernous and supraclinoid internal carotid arteries. Patent anterior communicating artery. Normal flow related enhancement of the anterior and middle cerebral arteries, including distal segments. No large vessel occlusion, high-grade stenosis, abnormal luminal irregularity, aneurysm. POSTERIOR CIRCULATION: LEFT vertebral artery is dominant. Basilar artery is patent, with normal flow related enhancement of the main branch vessels. Tiny LEFT posterior communicating artery, occluded LEFT proximal P2 segment with minimal collateralization by MRI. No large vessel occlusion, high-grade stenosis, abnormal luminal irregularity, aneurysm. IMPRESSION: MRI HEAD: Acute patchy LEFT posterior cerebral artery territory infarct (LEFT hippocampus and LEFT thalamus). Sub cm RIGHT cerebellar and LEFT occipital lobe acute infarcts. Status post  suboccipital  decompression for Chiari 1 malformation. 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 These results will be called to the ordering clinician or representative by the Radiologist Assistant, and communication documented in the PACS or zVision Dashboard. Electronically Signed   By: Elon Alas M.D.   On: 03/08/2016 05:21   Echocardiogram  pending  12-lead ECG sinus rhythm Telemetry sinus rhythm, no evidence of atrial fibrillation   Assessment and Plan 1. Cryptogenic stroke: Patient presented with cryptogenic stroke with some symptoms that have been improving since hospitalization. She says that she feels well currently. She is planning to have a TEE later today. She does live in [place], and therefore following up on her Linq would be difficult. Would therefore recommend her to follow-up with her primary physician to discuss further monitoring for atrial fibrillation. I did discuss with her the options of the Linq implantation, which she understands that might be a good plan of care for her. She understands that she will need to talk to her primary physician about this.  Signed, Will Meredith Leeds 03/09/2016, 10:08 AM

## 2016-03-09 NOTE — Progress Notes (Signed)
Patient ID: Amy Joseph, female   DOB: 06-07-1961, 55 y.o.   MRN: [id number]    PROGRESS NOTE    Lougene Kalajian  [id number] DOB: 1961/01/29 DOA: 03/07/2016  PCP: Pcp Not In System   Brief Narrative:  Admitted for stroke work up.   Assessment & Plan:    Left PCA, right cerebellar and left occipital infarcts, embolic secondary to unknown source but paradoxical embolism likely given presence of PFO and stroke happening after a long car ride.  MRI  Patchy left PCA, right cerebellar and left occipital infarcts  MRA  Left P2 occlusion  Carotid Doppler  No significant stenosis   2D Echo  EF 50-55% with no source of embolus  TEE positive for large PFO  Check LE dopplers to rule out VTE - pt refused   LDL 76  HgbA1c 7.3  Aspirin advised  Hypertension, essential   Stable  Hyperlipidemia  Home meds:  No statin  LDL 76, goal < 70  Added lipitor 20 mg this hospitalization  Continue statin at discharge  Diabetes type II with no specific complications   123456 7.3, goal < 7.0  Morbid Obesity,  - Body mass index is 51.25 kg/m., recommend weight loss, diet and exercise    Other Active Problems  History of Chiari malformation with decompression   DVT prophylaxis: SCD's, Lovenox Sq Code Status: Full  Family Communication: Patient at bedside  Disposition Plan: Home in AM  Consultants:   Neurology  Antimicrobials:   None  Subjective: No concerns today.  Objective: Vitals:   03/09/16 1445 03/09/16 1451 03/09/16 1500 03/09/16 1652  BP: 119/63 135/60 123/69 112/83  Pulse: 84  80 76  Resp: 19 18 16 16   Temp:  97.8 F (36.6 C)  98.1 F (36.7 C)  TempSrc:  Oral  Oral  SpO2: 97% 96% 96% 98%  Weight:      Height:        Intake/Output Summary (Last 24 hours) at 03/09/16 1842 Last data filed at 03/09/16 0000  Gross per 24 hour  Intake              240 ml  Output                0 ml  Net              240 ml   Filed Weights   03/07/16 2330   Weight: 131.2 kg (289 lb 4.8 oz)    Examination:  General exam: Appears calm and comfortable  Respiratory system: Clear to auscultation. Respiratory effort normal. Cardiovascular system: S1 & S2 heard, RRR. No JVD, murmurs, rubs, gallops or clicks.  Gastrointestinal system: Abdomen is nondistended, soft and nontender.  Central nervous system: Alert and oriented. No focal neurological deficits.  Data Reviewed: I have personally reviewed following labs and imaging studies  CBC:  Recent Labs Lab 03/07/16 1815 03/07/16 1838 03/09/16 0310  WBC 7.8  --  5.5  NEUTROABS 6.1  --   --   HGB 12.0 13.9 11.3*  HCT 39.5 41.0 36.9  MCV 88.2  --  87.9  PLT 341  --  [id number]   Basic Metabolic Panel:  Recent Labs Lab 03/07/16 1815 03/07/16 1838 03/09/16 0310  NA 141 143 141  K 4.0 4.1 3.4*  CL 106 103 107  CO2 27  --  28  GLUCOSE 149* 145* 125*  BUN 11 12 10   CREATININE 1.05* 1.00 0.86  CALCIUM 9.5  --  9.0  GFR: Estimated Creatinine Clearance: 96.7 mL/min (by C-G formula based on SCr of 0.86 mg/dL). Liver Function Tests:  Recent Labs Lab 03/07/16 1815  AST 51*  ALT 56*  ALKPHOS 75  BILITOT 1.0  PROT 6.6  ALBUMIN 3.7   Coagulation Profile:  Recent Labs Lab 03/07/16 1815  INR 1.12   HbA1C:  Recent Labs  03/08/16 0421  HGBA1C 7.3*   CBG:  Recent Labs Lab 03/08/16 1146 03/08/16 1648 03/09/16 0631 03/09/16 1353 03/09/16 1649  GLUCAP 181* 182* 149* 133* 211*   Lipid Profile:  Recent Labs  03/08/16 0421  CHOL 134  HDL 43  LDLCALC 76  TRIG 77  CHOLHDL 3.1   Urine analysis:    Component Value Date/Time   COLORURINE YELLOW 03/07/2016 1815   APPEARANCEUR CLEAR 03/07/2016 1815   LABSPEC 1.032 (H) 03/07/2016 1815   PHURINE 5.0 03/07/2016 1815   GLUCOSEU >1000 (A) 03/07/2016 1815   HGBUR NEGATIVE 03/07/2016 1815   BILIRUBINUR NEGATIVE 03/07/2016 1815   KETONESUR NEGATIVE 03/07/2016 1815   PROTEINUR NEGATIVE 03/07/2016 1815   NITRITE NEGATIVE  03/07/2016 1815   LEUKOCYTESUR SMALL (A) 03/07/2016 1815   Radiology Studies: Dg Chest 2 View  Result Date: 03/07/2016 CLINICAL DATA:  CVA.  Blurred vision. EXAM: CHEST  2 VIEW COMPARISON:  None. FINDINGS: Increased density over the spine in the lateral projection is likely from bulky spondylosis. Low volume chest with interstitial crowding but no convincing pneumonia. No edema or effusion. Normal heart size and mediastinal contours. IMPRESSION: No acute finding.  Mild low volumes. Electronically Signed   By: Monte Fantasia M.D.   On: 03/07/2016 23:12   Ct Head Wo Contrast  Result Date: 03/07/2016 CLINICAL DATA:  Patient with numbness and tingling after long drive. Blurred vision. EXAM: CT HEAD WITHOUT CONTRAST TECHNIQUE: Contiguous axial images were obtained from the base of the skull through the vertex without intravenous contrast. COMPARISON:  None. FINDINGS: Ventricles and sulci are appropriate for patient's age. There is linear high attenuation within the expected location of the left PCA artery (image 12; series 2). Suggestion of possible hypodensity within the left occipital lobe raising the possibility of early ischemic change. No evidence for intracranial hemorrhage, mass or mass effect. Orbits are unremarkable. Paranasal sinuses are well aerated. Mastoid air cells are unremarkable. Suboccipital craniectomy. IMPRESSION: High attenuation within the expected location of the left PCA artery, raising the possibility of thrombus. Additionally there is suggestion of possible early ischemic change within the left occipital lobe. These results were called by telephone at the time of interpretation on 03/07/2016 at 8:57 pm to Dr. Alfonzo Beers , who verbally acknowledged these results. Electronically Signed   By: Lovey Newcomer M.D.   On: 03/07/2016 21:00  Mr Brain Wo Contrast  Result Date: 03/08/2016 CLINICAL DATA:  Numbness and tingling after long drive, blurry vision. Follow-up possible stroke. EXAM:  MRI HEAD WITHOUT CONTRAST MRA HEAD WITHOUT CONTRAST TECHNIQUE: Multiplanar, multiecho pulse sequences of the brain and surrounding structures were obtained without intravenous contrast. Angiographic images of the head were obtained using MRA technique without contrast. COMPARISON:  CT HEAD March 07, 2016 at 2033 hours FINDINGS: MRI HEAD FINDINGS INTRACRANIAL CONTENTS: Reduced diffusion LEFT hippocampus, LEFT thalamus with corresponding low ADC values. Subcentimeter foci of reduced diffusion RIGHT cerebellum, LEFT occipital lobe. No susceptibility artifact to suggest hemorrhage. The ventricles and sulci are normal for patient's age. Scattered subcentimeter supratentorial white matter FLAIR T2 hyperintensities exclusive aforementioned abnormality compatible with mild chronic small vessel ischemic disease. No  suspicious parenchymal signal, masses or mass effect. No abnormal extra-axial fluid collections. No extra-axial masses though, contrast enhanced sequences would be more sensitive. 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 ORBITS: The included ocular globes and orbital contents are non-suspicious. SINUSES: The mastoid air-cells and included paranasal sinuses are well-aerated. SKULL/SOFT TISSUES: No abnormal sellar expansion. No suspicious calvarial bone marrow signal. Craniocervical junction maintained. Status post suboccipital decompressive craniectomy. MRA HEAD FINDINGS- Mild motion degraded examination. ANTERIOR CIRCULATION: Normal flow related enhancement of the included cervical, petrous, cavernous and supraclinoid internal carotid arteries. Patent anterior communicating artery. Normal flow related enhancement of the anterior and middle cerebral arteries, including distal segments. No large vessel occlusion, high-grade stenosis, abnormal luminal irregularity, aneurysm. POSTERIOR CIRCULATION: LEFT vertebral artery is dominant. Basilar artery is  patent, with normal flow related enhancement of the main branch vessels. Tiny LEFT posterior communicating artery, occluded LEFT proximal P2 segment with minimal collateralization by MRI. No large vessel occlusion, high-grade stenosis, abnormal luminal irregularity, aneurysm. IMPRESSION: MRI HEAD: Acute patchy LEFT posterior cerebral artery territory infarct (LEFT hippocampus and LEFT thalamus). Sub cm RIGHT cerebellar and LEFT occipital lobe acute infarcts. Status post suboccipital decompression for Chiari 1 malformation. 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 These results will be called to the ordering clinician or representative by the Radiologist Assistant, and communication documented in the PACS or zVision Dashboard. Electronically Signed   By: Elon Alas M.D.   On: 03/08/2016 05:21  Mr Jodene Nam Head/brain [id number] Cm  Result Date: 03/08/2016 CLINICAL DATA:  Numbness and tingling after long drive, blurry vision. Follow-up possible stroke. EXAM: MRI HEAD WITHOUT CONTRAST MRA HEAD WITHOUT CONTRAST TECHNIQUE: Multiplanar, multiecho pulse sequences of the brain and surrounding structures were obtained without intravenous contrast. Angiographic images of the head were obtained using MRA technique without contrast. COMPARISON:  CT HEAD March 07, 2016 at 2033 hours FINDINGS: MRI HEAD FINDINGS INTRACRANIAL CONTENTS: Reduced diffusion LEFT hippocampus, LEFT thalamus with corresponding low ADC values. Subcentimeter foci of reduced diffusion RIGHT cerebellum, LEFT occipital lobe. No susceptibility artifact to suggest hemorrhage. The ventricles and sulci are normal for patient's age. Scattered subcentimeter supratentorial white matter FLAIR T2 hyperintensities exclusive aforementioned abnormality compatible with mild chronic small vessel ischemic disease. No suspicious parenchymal signal, masses or mass effect. No abnormal extra-axial fluid collections. No extra-axial masses though,  contrast enhanced sequences would be more sensitive. 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 ORBITS: The included ocular globes and orbital contents are non-suspicious. SINUSES: The mastoid air-cells and included paranasal sinuses are well-aerated. SKULL/SOFT TISSUES: No abnormal sellar expansion. No suspicious calvarial bone marrow signal. Craniocervical junction maintained. Status post suboccipital decompressive craniectomy. MRA HEAD FINDINGS- Mild motion degraded examination. ANTERIOR CIRCULATION: Normal flow related enhancement of the included cervical, petrous, cavernous and supraclinoid internal carotid arteries. Patent anterior communicating artery. Normal flow related enhancement of the anterior and middle cerebral arteries, including distal segments. No large vessel occlusion, high-grade stenosis, abnormal luminal irregularity, aneurysm. POSTERIOR CIRCULATION: LEFT vertebral artery is dominant. Basilar artery is patent, with normal flow related enhancement of the main branch vessels. Tiny LEFT posterior communicating artery, occluded LEFT proximal P2 segment with minimal collateralization by MRI. No large vessel occlusion, high-grade stenosis, abnormal luminal irregularity, aneurysm. IMPRESSION: MRI HEAD: Acute patchy LEFT posterior cerebral artery territory infarct (LEFT hippocampus and LEFT thalamus). Sub cm RIGHT cerebellar and LEFT occipital lobe acute infarcts. Status post suboccipital decompression for Chiari  1 malformation. 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 These results will be called to the ordering clinician or representative by the Radiologist Assistant, and communication documented in the PACS or zVision Dashboard. Electronically Signed   By: Elon Alas M.D.   On: 03/08/2016 05:21     Scheduled Meds: . aspirin  300 mg Rectal Daily   Or  . aspirin  325 mg Oral Daily  .  atorvastatin  20 mg Oral q1800  . calcium carbonate  1,250 mg Oral Q breakfast  . enoxaparin (LOVENOX) injection  40 mg Subcutaneous Daily  . insulin aspart  0-15 Units Subcutaneous TID WC  . multivitamin with minerals  1 tablet Oral Daily  . vitamin B-12  1,000 mcg Oral Daily   Continuous Infusions: . sodium chloride 500 mL (03/09/16 1350)     LOS: 1 day    Time spent: 20 minutes    Faye Ramsay, MD [facility] Pager [phone]  If 7PM-7AM, please contact night-coverage [email or web address] Password [facility] 03/09/2016, 6:42 PM

## 2016-03-09 NOTE — H&P (View-Only) (Signed)
ELECTROPHYSIOLOGY CONSULT NOTE  Patient ID: Amy Joseph MRN: [id number], DOB/AGE: September 25, 1960   Admit date: 03/07/2016 Date of Consult: 03/09/2016  Primary Physician: Pcp Not In System Reason for Consultation: Cryptogenic stroke; recommendations regarding Implantable Loop Recorder  History of Present Illness Amy Joseph was admitted on 03/07/2016 with acute CVA. she has been monitored on telemetry which has demonstrated no arrhythmias. No cause has been identified. Inpatient stroke work-up is to be completed with a TEE. EP has been asked to evaluate for placement of an implantable loop recorder to monitor for atrial fibrillation.  Past Medical History Past Medical History:  Diagnosis Date  . Diabetes mellitus without complication ([place])   . Hypertension     Past Surgical History History reviewed. No pertinent surgical history.  Allergies/Intolerances Allergies  Allergen Reactions  . Codeine Itching and Rash  . Penicillins Itching and Rash    Has patient had a PCN reaction causing immediate rash, facial/tongue/throat swelling, SOB or lightheadedness with hypotension: Yes Has patient had a PCN reaction causing severe rash involving mucus membranes or skin necrosis: No Has patient had a PCN reaction that required hospitalization No Has patient had a PCN reaction occurring within the last 10 years: No If all of the above answers are "NO", then may proceed with Cephalosporin use.   Marland Kitchen Percocet [Oxycodone-Acetaminophen] Itching and Rash   Inpatient Medications . aspirin  300 mg Rectal Daily   Or  . aspirin  325 mg Oral Daily  . atorvastatin  20 mg Oral q1800  . calcium carbonate  1,250 mg Oral Q breakfast  . enoxaparin (LOVENOX) injection  40 mg Subcutaneous Daily  . insulin aspart  0-15 Units Subcutaneous TID WC  . multivitamin with minerals  1 tablet Oral Daily  . vitamin B-12  1,000 mcg Oral Daily   . sodium chloride Stopped (03/09/16 0000)   Social History Social  History   Social History  . Marital status: Single    Spouse name: N/A  . Number of children: N/A  . Years of education: N/A   Occupational History  . Not on file.   Social History Main Topics  . Smoking status: Never Smoker  . Smokeless tobacco: Not on file  . Alcohol use Yes  . Drug use: Unknown  . Sexual activity: Not Currently   Other Topics Concern  . Not on file   Social History Narrative  . No narrative on file    Review of Systems General: No chills, fever, night sweats or weight changes  Cardiovascular:  No chest pain, dyspnea on exertion, edema, orthopnea, palpitations, paroxysmal nocturnal dyspnea Dermatological: No rash, lesions or masses Respiratory: No cough, dyspnea Urologic: No hematuria, dysuria Abdominal: No nausea, vomiting, diarrhea, bright red blood per rectum, melena, or hematemesis Neurologic: No visual changes, weakness, changes in mental status All other systems reviewed and are otherwise negative except as noted above.  Physical Exam Blood pressure 125/63, pulse 71, temperature 97.9 F (36.6 C), temperature source Oral, resp. rate 16, height 5\' 3"  (1.6 m), weight 289 lb 4.8 oz (131.2 kg), SpO2 96 %.  General: Well developed, well appearing 55 y.o. female in no acute distress. HEENT: Normocephalic, atraumatic. EOMs intact. Sclera nonicteric. Oropharynx clear.  Neck: Supple without bruits. No JVD. Lungs: Respirations regular and unlabored, CTA bilaterally. No wheezes, rales or rhonchi. Heart: RRR. S1, S2 present. No murmurs, rub, S3 or S4. Abdomen: Soft, non-tender, non-distended. BS present x 4 quadrants. No hepatosplenomegaly.  Extremities: No clubbing, cyanosis or edema. DP/PT/Radials 2+  and equal bilaterally. Psych: Normal affect. Neuro: Alert and oriented X 3. Moves all extremities spontaneously. Musculoskeletal: No kyphosis. Skin: Intact. Warm and dry. No rashes or petechiae in exposed areas.   Labs Lab Results  Component Value Date    WBC 5.5 03/09/2016   HGB 11.3 (L) 03/09/2016   HCT 36.9 03/09/2016   MCV 87.9 03/09/2016   PLT 309 03/09/2016    Recent Labs Lab 03/07/16 1815  03/09/16 0310  NA 141  < > 141  K 4.0  < > 3.4*  CL 106  < > 107  CO2 27  --  28  BUN 11  < > 10  CREATININE 1.05*  < > 0.86  CALCIUM 9.5  --  9.0  PROT 6.6  --   --   BILITOT 1.0  --   --   ALKPHOS 75  --   --   ALT 56*  --   --   AST 51*  --   --   GLUCOSE 149*  < > 125*  < > = values in this interval not displayed.  Recent Labs  03/07/16 1815  INR 1.12    Radiology/Studies Dg Chest 2 View  Result Date: 03/07/2016 CLINICAL DATA:  CVA.  Blurred vision. EXAM: CHEST  2 VIEW COMPARISON:  None. FINDINGS: Increased density over the spine in the lateral projection is likely from bulky spondylosis. Low volume chest with interstitial crowding but no convincing pneumonia. No edema or effusion. Normal heart size and mediastinal contours. IMPRESSION: No acute finding.  Mild low volumes. Electronically Signed   By: Monte Fantasia M.D.   On: 03/07/2016 23:12   Ct Head Wo Contrast  Result Date: 03/07/2016 CLINICAL DATA:  Patient with numbness and tingling after long drive. Blurred vision. EXAM: CT HEAD WITHOUT CONTRAST TECHNIQUE: Contiguous axial images were obtained from the base of the skull through the vertex without intravenous contrast. COMPARISON:  None. FINDINGS: Ventricles and sulci are appropriate for patient's age. There is linear high attenuation within the expected location of the left PCA artery (image 12; series 2). Suggestion of possible hypodensity within the left occipital lobe raising the possibility of early ischemic change. No evidence for intracranial hemorrhage, mass or mass effect. Orbits are unremarkable. Paranasal sinuses are well aerated. Mastoid air cells are unremarkable. Suboccipital craniectomy. IMPRESSION: High attenuation within the expected location of the left PCA artery, raising the possibility of thrombus.  Additionally there is suggestion of possible early ischemic change within the left occipital lobe. These results were called by telephone at the time of interpretation on 03/07/2016 at 8:57 pm to Dr. Alfonzo Beers , who verbally acknowledged these results. Electronically Signed   By: Lovey Newcomer M.D.   On: 03/07/2016 21:00  Mr Brain Wo Contrast  Result Date: 03/08/2016 CLINICAL DATA:  Numbness and tingling after long drive, blurry vision. Follow-up possible stroke. EXAM: MRI HEAD WITHOUT CONTRAST MRA HEAD WITHOUT CONTRAST TECHNIQUE: Multiplanar, multiecho pulse sequences of the brain and surrounding structures were obtained without intravenous contrast. Angiographic images of the head were obtained using MRA technique without contrast. COMPARISON:  CT HEAD March 07, 2016 at 2033 hours FINDINGS: MRI HEAD FINDINGS INTRACRANIAL CONTENTS: Reduced diffusion LEFT hippocampus, LEFT thalamus with corresponding low ADC values. Subcentimeter foci of reduced diffusion RIGHT cerebellum, LEFT occipital lobe. No susceptibility artifact to suggest hemorrhage. The ventricles and sulci are normal for patient's age. Scattered subcentimeter supratentorial white matter FLAIR T2 hyperintensities exclusive aforementioned abnormality compatible with mild chronic small vessel ischemic  disease. No suspicious parenchymal signal, masses or mass effect. No abnormal extra-axial fluid collections. No extra-axial masses though, contrast enhanced sequences would be more sensitive. 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 ORBITS: The included ocular globes and orbital contents are non-suspicious. SINUSES: The mastoid air-cells and included paranasal sinuses are well-aerated. SKULL/SOFT TISSUES: No abnormal sellar expansion. No suspicious calvarial bone marrow signal. Craniocervical junction maintained. Status post suboccipital decompressive craniectomy. MRA HEAD FINDINGS- Mild motion  degraded examination. ANTERIOR CIRCULATION: Normal flow related enhancement of the included cervical, petrous, cavernous and supraclinoid internal carotid arteries. Patent anterior communicating artery. Normal flow related enhancement of the anterior and middle cerebral arteries, including distal segments. No large vessel occlusion, high-grade stenosis, abnormal luminal irregularity, aneurysm. POSTERIOR CIRCULATION: LEFT vertebral artery is dominant. Basilar artery is patent, with normal flow related enhancement of the main branch vessels. Tiny LEFT posterior communicating artery, occluded LEFT proximal P2 segment with minimal collateralization by MRI. No large vessel occlusion, high-grade stenosis, abnormal luminal irregularity, aneurysm. IMPRESSION: MRI HEAD: Acute patchy LEFT posterior cerebral artery territory infarct (LEFT hippocampus and LEFT thalamus). Sub cm RIGHT cerebellar and LEFT occipital lobe acute infarcts. Status post suboccipital decompression for Chiari 1 malformation. 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 These results My Amy Joseph be called to the ordering clinician or representative by the Radiologist Assistant, and communication documented in the PACS or zVision Dashboard. Electronically Signed   By: Elon Alas M.D.   On: 03/08/2016 05:21  Mr Amy Joseph Head/brain [id number] Cm  Result Date: 03/08/2016 CLINICAL DATA:  Numbness and tingling after long drive, blurry vision. Follow-up possible stroke. EXAM: MRI HEAD WITHOUT CONTRAST MRA HEAD WITHOUT CONTRAST TECHNIQUE: Multiplanar, multiecho pulse sequences of the brain and surrounding structures were obtained without intravenous contrast. Angiographic images of the head were obtained using MRA technique without contrast. COMPARISON:  CT HEAD March 07, 2016 at 2033 hours FINDINGS: MRI HEAD FINDINGS INTRACRANIAL CONTENTS: Reduced diffusion LEFT hippocampus, LEFT thalamus with corresponding low ADC values. Subcentimeter foci  of reduced diffusion RIGHT cerebellum, LEFT occipital lobe. No susceptibility artifact to suggest hemorrhage. The ventricles and sulci are normal for patient's age. Scattered subcentimeter supratentorial white matter FLAIR T2 hyperintensities exclusive aforementioned abnormality compatible with mild chronic small vessel ischemic disease. No suspicious parenchymal signal, masses or mass effect. No abnormal extra-axial fluid collections. No extra-axial masses though, contrast enhanced sequences would be more sensitive. 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 ORBITS: The included ocular globes and orbital contents are non-suspicious. SINUSES: The mastoid air-cells and included paranasal sinuses are well-aerated. SKULL/SOFT TISSUES: No abnormal sellar expansion. No suspicious calvarial bone marrow signal. Craniocervical junction maintained. Status post suboccipital decompressive craniectomy. MRA HEAD FINDINGS- Mild motion degraded examination. ANTERIOR CIRCULATION: Normal flow related enhancement of the included cervical, petrous, cavernous and supraclinoid internal carotid arteries. Patent anterior communicating artery. Normal flow related enhancement of the anterior and middle cerebral arteries, including distal segments. No large vessel occlusion, high-grade stenosis, abnormal luminal irregularity, aneurysm. POSTERIOR CIRCULATION: LEFT vertebral artery is dominant. Basilar artery is patent, with normal flow related enhancement of the main branch vessels. Tiny LEFT posterior communicating artery, occluded LEFT proximal P2 segment with minimal collateralization by MRI. No large vessel occlusion, high-grade stenosis, abnormal luminal irregularity, aneurysm. IMPRESSION: MRI HEAD: Acute patchy LEFT posterior cerebral artery territory infarct (LEFT hippocampus and LEFT thalamus). Sub cm RIGHT cerebellar and LEFT occipital lobe acute infarcts. Status post  suboccipital  decompression for Chiari 1 malformation. 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 These results Amy Joseph be called to the ordering clinician or representative by the Radiologist Assistant, and communication documented in the PACS or zVision Dashboard. Electronically Signed   By: Elon Alas M.D.   On: 03/08/2016 05:21   Echocardiogram  pending  12-lead ECG sinus rhythm Telemetry sinus rhythm, no evidence of atrial fibrillation   Assessment and Plan 1. Cryptogenic stroke: Patient presented with cryptogenic stroke with some symptoms that have been improving since hospitalization. She says that she feels well currently. She is planning to have a TEE later today. She does live in [place], and therefore following up on her Linq would be difficult. Would therefore recommend her to follow-up with her primary physician to discuss further monitoring for atrial fibrillation. I did discuss with her the options of the Linq implantation, which she understands that might be a good plan of care for her. She understands that she Amy Joseph need to talk to her primary physician about this.  Signed, Thaniel Coluccio Meredith Leeds 03/09/2016, 10:08 AM

## 2016-03-09 NOTE — Progress Notes (Signed)
STROKE TEAM PROGRESS NOTE   HISTORY OF PRESENT ILLNESS (per record) [name] who presents to the muscular emergency department for evaluation after developing vision changes, slurred speech, and tingling in the right face. The symptoms started approximately 1430 on the afternoon of 03/07/16. She was attending a funeral when she first noticed symptoms. She describes feeling as if she was seeing 2 images offset by one another by just a little bit. She also feels as if the vision in her right eye was blurry. At the same time, she felt that she had some tingling on the right side of her face. Family members are present at the bedside and they noted that she seemed confused with inability to recall her name or age. The patient denies any frank vision loss, weakness, or headache associated with these symptoms. She has no history of previous similar symptoms. She states that her symptoms largely resolved prior to the arrival in the emergency department this evening, though she still states that she feels "off."  She attributes her symptoms to fatigue. She states that she drove to [place] from [place], [place] after she got off work yesterday evening. As a result, she got very little sleep. She endorses some low-grade headaches that are nonspecific in nature. 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  Patient was not administered IV t-PA secondary to symptoms resolved. She was admitted for further evaluation and treatment.   SUBJECTIVE (INTERVAL HISTORY) Her daughter and close family member are at the bedside.  Overall she feels her condition is stable. Patient visiting her from [place] for a funeral for the "love of her life" - both had moved on but she wanted to attend.    OBJECTIVE Temp:  [97.9 F (36.6 C)-98.7 F (37.1 C)] 97.9 F (36.6 C) (07/24  0205) Pulse Rate:  [64-84] 71 (07/24 0536) Cardiac Rhythm: Normal sinus rhythm (07/24 0700) Resp:  [16-18] 16 (07/24 0536) BP: (125-142)/(62-85) 125/63 (07/24 0536) SpO2:  [92 %-99 %] 96 % (07/24 0536)  CBC:   Recent Labs Lab 03/07/16 1815 03/07/16 1838 03/09/16 0310  WBC 7.8  --  5.5  NEUTROABS 6.1  --   --   HGB 12.0 13.9 11.3*  HCT 39.5 41.0 36.9  MCV 88.2  --  87.9  PLT 341  --  [id number]    Basic Metabolic Panel:   Recent Labs Lab 03/07/16 1815 03/07/16 1838 03/09/16 0310  NA 141 143 141  K 4.0 4.1 3.4*  CL 106 103 107  CO2 27  --  28  GLUCOSE 149* 145* 125*  BUN 11 12 10   CREATININE 1.05* 1.00 0.86  CALCIUM 9.5  --  9.0    Lipid Panel:     Component Value Date/Time   CHOL 134 03/08/2016 0421   TRIG 77 03/08/2016 0421   HDL 43 03/08/2016 0421   CHOLHDL 3.1 03/08/2016 0421   VLDL 15 03/08/2016 0421   LDLCALC 76 03/08/2016 0421   HgbA1c:  Lab Results  Component Value Date   HGBA1C 7.3 (H) 03/08/2016   Urine Drug Screen:     Component Value Date/Time   LABOPIA NONE DETECTED 03/07/2016 1815   COCAINSCRNUR NONE DETECTED 03/07/2016 1815   LABBENZ NONE DETECTED 03/07/2016 1815   AMPHETMU NONE DETECTED 03/07/2016 1815   THCU NONE DETECTED 03/07/2016 1815   LABBARB NONE DETECTED 03/07/2016  [place] Chest 2 View  Result Date: 03/07/2016 CLINICAL DATA:  CVA.  Blurred vision. EXAM: CHEST  2 VIEW COMPARISON:  None. FINDINGS: Increased density over the spine in the lateral projection is likely from bulky spondylosis. Low volume chest with interstitial crowding but no convincing pneumonia. No edema or effusion. Normal heart size and mediastinal contours. IMPRESSION: No acute finding.  Mild low volumes. Electronically Signed   By: Monte Fantasia M.D.   On: 03/07/2016 23:12   Ct Head Wo Contrast  Result Date: 03/07/2016 CLINICAL DATA:  Patient with numbness and tingling after long drive. Blurred vision. EXAM: CT HEAD WITHOUT CONTRAST TECHNIQUE:  Contiguous axial images were obtained from the base of the skull through the vertex without intravenous contrast. COMPARISON:  None. FINDINGS: Ventricles and sulci are appropriate for patient's age. There is linear high attenuation within the expected location of the left PCA artery (image 12; series 2). Suggestion of possible hypodensity within the left occipital lobe raising the possibility of early ischemic change. No evidence for intracranial hemorrhage, mass or mass effect. Orbits are unremarkable. Paranasal sinuses are well aerated. Mastoid air cells are unremarkable. Suboccipital craniectomy. IMPRESSION: High attenuation within the expected location of the left PCA artery, raising the possibility of thrombus. Additionally there is suggestion of possible early ischemic change within the left occipital lobe. These results were called by telephone at the time of interpretation on 03/07/2016 at 8:57 pm to Dr. Alfonzo Beers , who verbally acknowledged these results. Electronically Signed   By: Lovey Newcomer M.D.   On: 03/07/2016 21:00  Mr Brain Wo Contrast  Result Date: 03/08/2016 CLINICAL DATA:  Numbness and tingling after long drive, blurry vision. Follow-up possible stroke. EXAM: MRI HEAD WITHOUT CONTRAST MRA HEAD WITHOUT CONTRAST TECHNIQUE: Multiplanar, multiecho pulse sequences of the brain and surrounding structures were obtained without intravenous contrast. Angiographic images of the head were obtained using MRA technique without contrast. COMPARISON:  CT HEAD March 07, 2016 at 2033 hours FINDINGS: MRI HEAD FINDINGS INTRACRANIAL CONTENTS: Reduced diffusion LEFT hippocampus, LEFT thalamus with corresponding low ADC values. Subcentimeter foci of reduced diffusion RIGHT cerebellum, LEFT occipital lobe. No susceptibility artifact to suggest hemorrhage. The ventricles and sulci are normal for patient's age. Scattered subcentimeter supratentorial white matter FLAIR T2 hyperintensities exclusive aforementioned  abnormality compatible with mild chronic small vessel ischemic disease. No suspicious parenchymal signal, masses or mass effect. No abnormal extra-axial fluid collections. No extra-axial masses though, contrast enhanced sequences would be more sensitive. 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 ORBITS: The included ocular globes and orbital contents are non-suspicious. SINUSES: The mastoid air-cells and included paranasal sinuses are well-aerated. SKULL/SOFT TISSUES: No abnormal sellar expansion. No suspicious calvarial bone marrow signal. Craniocervical junction maintained. Status post suboccipital decompressive craniectomy. MRA HEAD FINDINGS- Mild motion degraded examination. ANTERIOR CIRCULATION: Normal flow related enhancement of the included cervical, petrous, cavernous and supraclinoid internal carotid arteries. Patent anterior communicating artery. Normal flow related enhancement of the anterior and middle cerebral arteries, including distal segments. No large vessel occlusion, high-grade stenosis, abnormal luminal irregularity, aneurysm. POSTERIOR CIRCULATION: LEFT vertebral artery is dominant. Basilar artery is patent, with normal flow related enhancement of the main branch vessels. Tiny LEFT posterior communicating artery, occluded LEFT proximal P2 segment with minimal collateralization by MRI. No large vessel occlusion, high-grade stenosis, abnormal luminal irregularity, aneurysm. IMPRESSION: MRI HEAD: Acute patchy LEFT posterior cerebral artery territory infarct (LEFT hippocampus and LEFT thalamus).  Sub cm RIGHT cerebellar and LEFT occipital lobe acute infarcts. Status post suboccipital decompression for Chiari 1 malformation. 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 These results will be called to the ordering clinician or representative by the Radiologist Assistant, and communication documented in the PACS  or zVision Dashboard. Electronically Signed   By: Elon Alas M.D.   On: 03/08/2016 05:21  Mr Jodene Nam Head/brain [id number] Cm  Result Date: 03/08/2016 CLINICAL DATA:  Numbness and tingling after long drive, blurry vision. Follow-up possible stroke. EXAM: MRI HEAD WITHOUT CONTRAST MRA HEAD WITHOUT CONTRAST TECHNIQUE: Multiplanar, multiecho pulse sequences of the brain and surrounding structures were obtained without intravenous contrast. Angiographic images of the head were obtained using MRA technique without contrast. COMPARISON:  CT HEAD March 07, 2016 at 2033 hours FINDINGS: MRI HEAD FINDINGS INTRACRANIAL CONTENTS: Reduced diffusion LEFT hippocampus, LEFT thalamus with corresponding low ADC values. Subcentimeter foci of reduced diffusion RIGHT cerebellum, LEFT occipital lobe. No susceptibility artifact to suggest hemorrhage. The ventricles and sulci are normal for patient's age. Scattered subcentimeter supratentorial white matter FLAIR T2 hyperintensities exclusive aforementioned abnormality compatible with mild chronic small vessel ischemic disease. No suspicious parenchymal signal, masses or mass effect. No abnormal extra-axial fluid collections. No extra-axial masses though, contrast enhanced sequences would be more sensitive. 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 ORBITS: The included ocular globes and orbital contents are non-suspicious. SINUSES: The mastoid air-cells and included paranasal sinuses are well-aerated. SKULL/SOFT TISSUES: No abnormal sellar expansion. No suspicious calvarial bone marrow signal. Craniocervical junction maintained. Status post suboccipital decompressive craniectomy. MRA HEAD FINDINGS- Mild motion degraded examination. ANTERIOR CIRCULATION: Normal flow related enhancement of the included cervical, petrous, cavernous and supraclinoid internal carotid arteries. Patent anterior communicating artery. Normal flow related  enhancement of the anterior and middle cerebral arteries, including distal segments. No large vessel occlusion, high-grade stenosis, abnormal luminal irregularity, aneurysm. POSTERIOR CIRCULATION: LEFT vertebral artery is dominant. Basilar artery is patent, with normal flow related enhancement of the main branch vessels. Tiny LEFT posterior communicating artery, occluded LEFT proximal P2 segment with minimal collateralization by MRI. No large vessel occlusion, high-grade stenosis, abnormal luminal irregularity, aneurysm. IMPRESSION: MRI HEAD: Acute patchy LEFT posterior cerebral artery territory infarct (LEFT hippocampus and LEFT thalamus). Sub cm RIGHT cerebellar and LEFT occipital lobe acute infarcts. Status post suboccipital decompression for Chiari 1 malformation. 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 These results will be called to the ordering clinician or representative by the Radiologist Assistant, and communication documented in the PACS or zVision Dashboard. Electronically Signed   By: Elon Alas M.D.   On: 03/08/2016 05:21  Carotid Doppler   There is 1-39% bilateral ICA stenosis. Vertebral artery flow is antegrade.    2-D echocardiogram - Left ventricle: The cavity size was normal. There was mild concentric hypertrophy. Systolic function was normal. The estimated ejection fraction was in the range of 50% to 55%. Wall motion was normal; there were no regional wall motion abnormalities. - Atrial septum: No defect or patent foramen ovale was identified. Impressions:  - No cardiac source of emboli was indentified.   PHYSICAL EXAM Pleasant middle-aged lady currently not in distress. . Afebrile. Head is nontraumatic. Neck is supple without bruit.    Cardiac exam no murmur or gallop. Lungs are clear to auscultation. Distal pulses are well felt. Neurological Exam ;  Awake  Alert oriented x 3. Normal speech and language.eye movements full without  nystagmus.fundi were not visualized. Vision acuity and fields appear normal. Hearing is normal. Palatal movements are normal. Face symmetric. Tongue midline. Normal strength, tone, reflexes and coordination. Normal sensation. Gait deferred.  ASSESSMENT/PLAN Ms. Amy Joseph is a 55 y.o. female with history of hypertension, diabetes and obesity presenting with visual changes, slurred speech and tingling of the right face. She did not receive IV t-PA due to symptom resolution.   Stroke:  Left PCA, right cerebellar and left occipital infarcts, embolic secondary to unknown source  MRI  Patchy left PCA, right cerebellar and left occipital infarcts  MRA  Left P2 occlusion  Carotid Doppler  No significant stenosis   2D Echo  EF 50-55% with no source of embolus  TEE at 2P today to look for embolic source of stroke. Of positive for PFO, check LE dopplers for VTE.  [place] electrophysiologist has consulted and recommends placement of an implantable loop recorder to evaluate for atrial fibrillation as etiology of stroke. This has been explained to patient/family by Dr. Leonie Man and she is  agreeable. Dr. Baird Kay has seen and recommends placement in [facility] given difficulty of placing here and difficulty to monitor/treat if needed  LDL 76  HgbA1c 7.3  Lovenox 40 mg sq daily for VTE prophylaxis Diet NPO time specified Except for: Sips with Meds  No antithrombotic prior to admission, now on aspirin 325 mg daily  Patient counseled to be compliant with her antithrombotic medications  Ongoing aggressive stroke risk factor management  Therapy recommendations:  pending   Disposition:  pending   Hypertension  Stable  Permissive hypertension (OK if < 220/120) but gradually normalize in 5-7 days  Long-term BP goal normotensive  Hyperlipidemia  Home meds:  No statin  LDL 76, goal < 70  Added lipitor 20 mg this hospitalization  Continue statin at  discharge  Diabetes  HgbA1c 7.3, goal < 7.0  Uncontrolled  Other Stroke Risk Factors  ETOH use, advised to drink no more than 1 drink(s) a day  Morbid Obesity, Body mass index is 51.25 kg/m., recommend weight loss, diet and exercise as appropriate   Other Active Problems  History of Chiari malformation with decompression  Hospital day # [place] for Pager information 03/09/2016 4:56 PM  I have personally examined this patient, reviewed notes, independently viewed imaging studies, participated in medical decision making and plan of care. I have made any additions or clarifications directly to the above note. Agree with note above. . Check TEE and loop recorder insertion today. Greater than 50% time during this 25 minute visit was spent on counseling and coordination of care of her stroke risk, prevention and treatment. Discussed with patient and family and answered questions  Antony Contras, MD Medical Director [facility] Pager: [phone] 03/09/2016 6:17 PM    To contact Stroke Continuity provider, please refer to [email or web address] After hours, contact General Neurology

## 2016-03-09 NOTE — Interval H&P Note (Signed)
History and Physical Interval Note:  03/09/2016 1:44 PM  Amy Joseph  has presented today for surgery, with the diagnosis of stroke  The various methods of treatment have been discussed with the patient and family. After consideration of risks, benefits and other options for treatment, the patient has consented to  Procedure(s): TRANSESOPHAGEAL ECHOCARDIOGRAM (TEE) (N/A) as a surgical intervention .  The patient's history has been reviewed, patient examined, no change in status, stable for surgery.  I have reviewed the patient's chart and labs.  Questions were answered to the patient's satisfaction.     Dorris Carnes

## 2016-03-09 NOTE — Progress Notes (Signed)
  Echocardiogram 2D Echocardiogram has been performed.  Amy Joseph 03/09/2016, 10:05 AM

## 2016-03-10 LAB — BASIC METABOLIC PANEL
Anion gap: 7 (ref 5–15)
BUN: 8 mg/dL (ref 6–20)
CALCIUM: 9.1 mg/dL (ref 8.9–10.3)
CO2: 25 mmol/L (ref 22–32)
CREATININE: 0.9 mg/dL (ref 0.44–1.00)
Chloride: 107 mmol/L (ref 101–111)
GFR calc Af Amer: >60 mL/min (ref >60)
GFR calc non Af Amer: >60 mL/min (ref >60)
GLUCOSE: 177 mg/dL — AB (ref 65–99)
Potassium: 3.8 mmol/L (ref 3.5–5.1)
Sodium: 139 mmol/L (ref 135–145)

## 2016-03-10 LAB — VAS US CAROTID
LCCADDIAS: -29 cm/s
LCCADSYS: -80 cm/s
LCCAPDIAS: 32 cm/s
LEFT ECA DIAS: -25 cm/s
LEFT VERTEBRAL DIAS: 15 cm/s
LICADDIAS: -40 cm/s
LICADSYS: -85 cm/s
LICAPDIAS: -39 cm/s
LICAPSYS: -82 cm/s
Left CCA prox sys: 104 cm/s
RCCAPSYS: 112 cm/s
RIGHT ECA DIAS: -21 cm/s
RIGHT VERTEBRAL DIAS: 15 cm/s
Right CCA prox dias: 32 cm/s
Right cca dist sys: -87 cm/s

## 2016-03-10 LAB — CBC
HEMATOCRIT: 41.3 % (ref 36.0–46.0)
Hemoglobin: 12.8 g/dL (ref 12.0–15.0)
MCH: 27.2 pg (ref 26.0–34.0)
MCHC: 31 g/dL (ref 30.0–36.0)
MCV: 87.7 fL (ref 78.0–100.0)
Platelets: 317 10*3/uL (ref 150–400)
RBC: 4.71 MIL/uL (ref 3.87–5.11)
RDW: 14.3 % (ref 11.5–15.5)
WBC: 6.2 10*3/uL (ref 4.0–10.5)

## 2016-03-10 LAB — PROTEIN S ACTIVITY: PROTEIN S ACTIVITY: 96 % (ref 63–140)

## 2016-03-10 LAB — GLUCOSE, CAPILLARY
Glucose-Capillary: 152 mg/dL — ABNORMAL HIGH (ref 65–99)
Glucose-Capillary: 136 mg/dL — ABNORMAL HIGH (ref 65–99)
Glucose-Capillary: 166 mg/dL — ABNORMAL HIGH (ref 65–99)

## 2016-03-10 LAB — LUPUS ANTICOAGULANT PANEL
DRVVT: 38.5 s (ref 0.0–47.0)
PTT LA: 32.4 s (ref 0.0–51.9)

## 2016-03-10 LAB — PROTEIN C ACTIVITY: Protein C Activity: 165 % (ref 73–180)

## 2016-03-10 LAB — PROTEIN C, TOTAL: Protein C, Total: 116 % (ref 60–150)

## 2016-03-10 LAB — PROTEIN S, TOTAL: PROTEIN S AG TOTAL: 111 % (ref 60–150)

## 2016-03-10 MED ORDER — ASPIRIN 81 MG PO TABS: 81.0000 mg | ORAL_TABLET | Freq: Every day | ORAL | Status: DC

## 2016-03-10 MED ORDER — ATORVASTATIN CALCIUM 20 MG PO TABS: 20.0000 mg | ORAL_TABLET | Freq: Every day | ORAL | 0 refills | Status: DC

## 2016-03-10 NOTE — Care Management Note (Signed)
Case Management Note  Patient Details  Name: Amy Joseph MRN: [id number] Date of Birth: [birth year]  Subjective/Objective:                    Action/Plan: Pt discharging home with self care. No further needs per CM.   Expected Discharge Date:                  Expected Discharge Plan:  Home/Self Care  In-House Referral:     Discharge planning Services     Post Acute Care Choice:    Choice offered to:     DME Arranged:    DME Agency:     HH Arranged:    [place] Agency:     Status of Service:  Completed, signed off  If discussed at [employer] of Stay Meetings, dates discussed:    Additional Comments:  Pollie Friar, RN 03/10/2016, 3:42 PM

## 2016-03-10 NOTE — Discharge Summary (Signed)
Physician Discharge Summary  Amy Joseph [id number] DOB: Mar 01, 1961 DOA: 03/07/2016  PCP: Pcp Not In System  Admit date: 03/07/2016 Discharge date: 03/10/2016  Recommendations for Outpatient Follow-up:  1. Pt will need to follow up with PCP in 2-3 weeks post discharge 2. Please obtain BMP to evaluate electrolytes and kidney function  Discharge Diagnoses:  Principal Problem:   CVA (cerebral infarction) Active Problems:   Hypertension   Type II diabetes mellitus (HCC)   Numbness and tingling   Blurred vision   Acute CVA (cerebrovascular accident) ([place])   Elevated transaminase level    Discharge Condition: Stable  Diet recommendation: Heart healthy diet discussed in details   History of present illness:  55 y.o. female with medical history significant for hypertension, diabetes mellitus, and obesity who presents the emergency department for evaluation of right-sided facial paresthesias, slurred speech, and blurred vision involving the right eye. Patient reports that she drove to [place] from the [place] area overnight last night to attend a funeral here and did not get much sleep at all. She reports being in her usual state of health when attending the funeral, but towards the end, around 2:30 PM, she noted numbness and tingling in the right side of her face and blurred vision involving the right eye. EMS was activated for transport to the hospital and all deficits had results completely by time of her arrival.   Hospital Course:     Left PCA, right cerebellar and left occipital infarcts, embolic secondary to unknown source but paradoxical embolism likely given presence of PFO and stroke happening after a long car ride.  MRI  Patchy left PCA, right cerebellar and left occipital infarcts  MRA  Left P2 occlusion  Carotid Doppler  No significant stenosis   2D Echo  EF 50-55% with no source of embolus  TEE positive for large PFO  Check LE dopplers to rule out VTE -  pt refused   LDL 76  HgbA1c 7.3  Aspirin advised, pt wants to stay on 81 mg PO QD and will discuss with her PCP if needs to increase the dose   Hypertension, essential   Stable  Hyperlipidemia  Home meds:  No statin  LDL 76, goal < 70  Added lipitor 20 mg this hospitalization  Continue statin at discharge  Diabetes type II with no specific complications   123456 7.3, goal < 7.0  Morbid Obesity,  - Body mass index is 51.25 kg/m., recommend weight loss, diet and exercise    Other Active Problems  History of Chiari malformation with decompression   DVT prophylaxis: SCD's, Lovenox Sq Code Status: Full  Family Communication: Patient at bedside  Disposition Plan: Home in AM  Consultants:   Neurology  Antimicrobials:   None  Procedures/Studies: Dg Chest 2 View  Result Date: 03/07/2016 CLINICAL DATA:  CVA.  Blurred vision. EXAM: CHEST  2 VIEW COMPARISON:  None. FINDINGS: Increased density over the spine in the lateral projection is likely from bulky spondylosis. Low volume chest with interstitial crowding but no convincing pneumonia. No edema or effusion. Normal heart size and mediastinal contours. IMPRESSION: No acute finding.  Mild low volumes. Electronically Signed   By: Monte Fantasia M.D.   On: 03/07/2016 23:12   Ct Head Wo Contrast  Result Date: 03/07/2016 CLINICAL DATA:  Patient with numbness and tingling after long drive. Blurred vision. EXAM: CT HEAD WITHOUT CONTRAST TECHNIQUE: Contiguous axial images were obtained from the base of the skull through the vertex without intravenous contrast.  COMPARISON:  None. FINDINGS: Ventricles and sulci are appropriate for patient's age. There is linear high attenuation within the expected location of the left PCA artery (image 12; series 2). Suggestion of possible hypodensity within the left occipital lobe raising the possibility of early ischemic change. No evidence for intracranial hemorrhage, mass or mass effect.  Orbits are unremarkable. Paranasal sinuses are well aerated. Mastoid air cells are unremarkable. Suboccipital craniectomy. IMPRESSION: High attenuation within the expected location of the left PCA artery, raising the possibility of thrombus. Additionally there is suggestion of possible early ischemic change within the left occipital lobe. These results were called by telephone at the time of interpretation on 03/07/2016 at 8:57 pm to Dr. Alfonzo Beers , who verbally acknowledged these results. Electronically Signed   By: Lovey Newcomer M.D.   On: 03/07/2016 21:00  Mr Brain Wo Contrast  Result Date: 03/08/2016 CLINICAL DATA:  Numbness and tingling after long drive, blurry vision. Follow-up possible stroke. EXAM: MRI HEAD WITHOUT CONTRAST MRA HEAD WITHOUT CONTRAST TECHNIQUE: Multiplanar, multiecho pulse sequences of the brain and surrounding structures were obtained without intravenous contrast. Angiographic images of the head were obtained using MRA technique without contrast. COMPARISON:  CT HEAD March 07, 2016 at 2033 hours FINDINGS: MRI HEAD FINDINGS INTRACRANIAL CONTENTS: Reduced diffusion LEFT hippocampus, LEFT thalamus with corresponding low ADC values. Subcentimeter foci of reduced diffusion RIGHT cerebellum, LEFT occipital lobe. No susceptibility artifact to suggest hemorrhage. The ventricles and sulci are normal for patient's age. Scattered subcentimeter supratentorial white matter FLAIR T2 hyperintensities exclusive aforementioned abnormality compatible with mild chronic small vessel ischemic disease. No suspicious parenchymal signal, masses or mass effect. No abnormal extra-axial fluid collections. No extra-axial masses though, contrast enhanced sequences would be more sensitive. 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 ORBITS: The included ocular globes and orbital contents are non-suspicious. SINUSES: The mastoid air-cells and included  paranasal sinuses are well-aerated. SKULL/SOFT TISSUES: No abnormal sellar expansion. No suspicious calvarial bone marrow signal. Craniocervical junction maintained. Status post suboccipital decompressive craniectomy. MRA HEAD FINDINGS- Mild motion degraded examination. ANTERIOR CIRCULATION: Normal flow related enhancement of the included cervical, petrous, cavernous and supraclinoid internal carotid arteries. Patent anterior communicating artery. Normal flow related enhancement of the anterior and middle cerebral arteries, including distal segments. No large vessel occlusion, high-grade stenosis, abnormal luminal irregularity, aneurysm. POSTERIOR CIRCULATION: LEFT vertebral artery is dominant. Basilar artery is patent, with normal flow related enhancement of the main branch vessels. Tiny LEFT posterior communicating artery, occluded LEFT proximal P2 segment with minimal collateralization by MRI. No large vessel occlusion, high-grade stenosis, abnormal luminal irregularity, aneurysm. IMPRESSION: MRI HEAD: Acute patchy LEFT posterior cerebral artery territory infarct (LEFT hippocampus and LEFT thalamus). Sub cm RIGHT cerebellar and LEFT occipital lobe acute infarcts. Status post suboccipital decompression for Chiari 1 malformation. 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 These results will be called to the ordering clinician or representative by the Radiologist Assistant, and communication documented in the PACS or zVision Dashboard. Electronically Signed   By: Elon Alas M.D.   On: 03/08/2016 05:21  Mr Jodene Nam Head/brain [id number] Cm  Result Date: 03/08/2016 CLINICAL DATA:  Numbness and tingling after long drive, blurry vision. Follow-up possible stroke. EXAM: MRI HEAD WITHOUT CONTRAST MRA HEAD WITHOUT CONTRAST TECHNIQUE: Multiplanar, multiecho pulse sequences of the brain and surrounding structures were obtained without intravenous contrast. Angiographic images of the head were  obtained using MRA technique without contrast. COMPARISON:  CT HEAD March 07, 2016 at 2033 hours FINDINGS: MRI HEAD FINDINGS INTRACRANIAL CONTENTS: Reduced diffusion LEFT hippocampus, LEFT thalamus with corresponding low ADC values. Subcentimeter foci of reduced diffusion RIGHT cerebellum, LEFT occipital lobe. No susceptibility artifact to suggest hemorrhage. The ventricles and sulci are normal for patient's age. Scattered subcentimeter supratentorial white matter FLAIR T2 hyperintensities exclusive aforementioned abnormality compatible with mild chronic small vessel ischemic disease. No suspicious parenchymal signal, masses or mass effect. No abnormal extra-axial fluid collections. No extra-axial masses though, contrast enhanced sequences would be more sensitive. 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 ORBITS: The included ocular globes and orbital contents are non-suspicious. SINUSES: The mastoid air-cells and included paranasal sinuses are well-aerated. SKULL/SOFT TISSUES: No abnormal sellar expansion. No suspicious calvarial bone marrow signal. Craniocervical junction maintained. Status post suboccipital decompressive craniectomy. MRA HEAD FINDINGS- Mild motion degraded examination. ANTERIOR CIRCULATION: Normal flow related enhancement of the included cervical, petrous, cavernous and supraclinoid internal carotid arteries. Patent anterior communicating artery. Normal flow related enhancement of the anterior and middle cerebral arteries, including distal segments. No large vessel occlusion, high-grade stenosis, abnormal luminal irregularity, aneurysm. POSTERIOR CIRCULATION: LEFT vertebral artery is dominant. Basilar artery is patent, with normal flow related enhancement of the main branch vessels. Tiny LEFT posterior communicating artery, occluded LEFT proximal P2 segment with minimal collateralization by MRI. No large vessel occlusion, high-grade  stenosis, abnormal luminal irregularity, aneurysm. IMPRESSION: MRI HEAD: Acute patchy LEFT posterior cerebral artery territory infarct (LEFT hippocampus and LEFT thalamus). Sub cm RIGHT cerebellar and LEFT occipital lobe acute infarcts. Status post suboccipital decompression for Chiari 1 malformation. 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 These results will be called to the ordering clinician or representative by the Radiologist Assistant, and communication documented in the PACS or zVision Dashboard. Electronically Signed   By: Elon Alas M.D.   On: 03/08/2016 05:21   Discharge Exam: Vitals:   03/10/16 0557 03/10/16 1042  BP: (!) 107/56 133/73  Pulse: 80 91  Resp: 18 20  Temp: 98 F (36.7 C) 98.6 F (37 C)   Vitals:   03/09/16 2157 03/10/16 0150 03/10/16 0557 03/10/16 1042  BP: 112/71 102/69 (!) 107/56 133/73  Pulse: 76 81 80 91  Resp:  18 18 20   Temp: 98.8 F (37.1 C) 98 F (36.7 C) 98 F (36.7 C) 98.6 F (37 C)  TempSrc: Oral Oral Oral Oral  SpO2: 96% 95% 96% 94%  Weight:      Height:        General: Pt is alert, follows commands appropriately, not in acute distress Cardiovascular: Regular rate and rhythm, S1/S2 +, no murmurs, no rubs, no gallops Respiratory: Clear to auscultation bilaterally, no wheezing, no crackles, no rhonchi Abdominal: Soft, non tender, non distended, bowel sounds +, no guarding Extremities: no edema, no cyanosis, pulses palpable bilaterally DP and PT Neuro: Grossly nonfocal  Discharge Instructions  Discharge Instructions    Diet - low sodium heart healthy    Complete by:  As directed   Increase activity slowly    Complete by:  As directed       Medication List    TAKE these medications   albuterol 108 (90 Base) MCG/ACT inhaler Commonly known as:  PROVENTIL HFA;VENTOLIN HFA Inhale 1-2 puffs into the lungs every 6 (six) hours as needed for wheezing or shortness of breath.   aspirin 81 MG  tablet Take 1 tablet (81 mg total) by mouth daily.   atorvastatin 20  MG tablet Commonly known as:  LIPITOR Take 1 tablet (20 mg total) by mouth daily at 6 PM.   bisoprolol-hydrochlorothiazide 5-6.25 MG tablet Commonly known as:  ZIAC Take 1 tablet by mouth daily.   CALCIUM PO Take 1 tablet by mouth daily.   JARDIANCE 10 MG Tabs tablet Generic drug:  empagliflozin Take 10 mg by mouth daily.   metFORMIN 1000 MG tablet Commonly known as:  GLUCOPHAGE Take 1,000 mg by mouth 2 (two) times daily with a meal.   montelukast 10 MG tablet Commonly known as:  SINGULAIR Take 10 mg by mouth at bedtime.   MULTIVITAMIN ADULTS 50+ Tabs Take 1 tablet by mouth daily.   TRULICITY 1.5 0000000 Sopn Generic drug:  Dulaglutide Inject into the skin once a week.   VITAMIN A & D PO Take 1 tablet by mouth daily.   vitamin B-12 1000 MCG tablet Commonly known as:  CYANOCOBALAMIN Take 1,000 mcg by mouth daily.         The results of significant diagnostics from this hospitalization (including imaging, microbiology, ancillary and laboratory) are listed below for reference.     Microbiology: No results found for this or any previous visit (from the past 240 hour(s)).   Labs: Basic Metabolic Panel:  Recent Labs Lab 03/07/16 1815 03/07/16 1838 03/09/16 0310 03/10/16 1119  NA 141 143 141 139  K 4.0 4.1 3.4* 3.8  CL 106 103 107 107  CO2 27  --  28 25  GLUCOSE 149* 145* 125* 177*  BUN 11 12 10 8   CREATININE 1.05* 1.00 0.86 0.90  CALCIUM 9.5  --  9.0 9.1   Liver Function Tests:  Recent Labs Lab 03/07/16 1815  AST 51*  ALT 56*  ALKPHOS 75  BILITOT 1.0  PROT 6.6  ALBUMIN 3.7   No results for input(s): LIPASE, AMYLASE in the last 168 hours. No results for input(s): AMMONIA in the last 168 hours. CBC:  Recent Labs Lab 03/07/16 1815 03/07/16 1838 03/09/16 0310 03/10/16 1119  WBC 7.8  --  5.5 6.2  NEUTROABS 6.1  --   --   --   HGB 12.0 13.9 11.3* 12.8  HCT 39.5 41.0  36.9 41.3  MCV 88.2  --  87.9 87.7  PLT 341  --  309 317    CBG:  Recent Labs Lab 03/09/16 1353 03/09/16 1649 03/09/16 2132 03/10/16 0625 03/10/16 1140  GLUCAP 133* 211* 137* 136* 166*     SIGNED: Time coordinating discharge: 30 minutes  MAGICK-Zendaya Groseclose, MD  [facility] 03/10/2016, 3:41 PM Pager [phone]  If 7PM-7AM, please contact night-coverage [email or web address] Password [email or web address]

## 2016-03-10 NOTE — Progress Notes (Signed)
Pt d/c to home by car with family. Assessment stable. Prescriptions given. All questions answered. 

## 2016-03-10 NOTE — Discharge Instructions (Signed)
Stroke Prevention Some medical conditions and behaviors are associated with an increased chance of having a stroke. You may prevent a stroke by making healthy choices and managing medical conditions. HOW CAN I REDUCE MY RISK OF HAVING A STROKE?   Stay physically active. Get at least 30 minutes of activity on most or all days.  Do not smoke. It may also be helpful to avoid exposure to secondhand smoke.  Limit alcohol use. Moderate alcohol use is considered to be:  No more than 2 drinks per day for men.  No more than 1 drink per day for nonpregnant women.  Eat healthy foods. This involves:  Eating 5 or more servings of fruits and vegetables a day.  Making dietary changes that address high blood pressure (hypertension), high cholesterol, diabetes, or obesity.  Manage your cholesterol levels.  Making food choices that are high in fiber and low in saturated fat, trans fat, and cholesterol may control cholesterol levels.  Take any prescribed medicines to control cholesterol as directed by your health care provider.  Manage your diabetes.  Controlling your carbohydrate and sugar intake is recommended to manage diabetes.  Take any prescribed medicines to control diabetes as directed by your health care provider.  Control your hypertension.  Making food choices that are low in salt (sodium), saturated fat, trans fat, and cholesterol is recommended to manage hypertension.  Ask your health care provider if you need treatment to lower your blood pressure. Take any prescribed medicines to control hypertension as directed by your health care provider.  If you are 18-39 years of age, have your blood pressure checked every 3-5 years. If you are 40 years of age or older, have your blood pressure checked every year.  Maintain a healthy weight.  Reducing calorie intake and making food choices that are low in sodium, saturated fat, trans fat, and cholesterol are recommended to manage  weight.  Stop drug abuse.  Avoid taking birth control pills.  Talk to your health care provider about the risks of taking birth control pills if you are over 35 years old, smoke, get migraines, or have ever had a blood clot.  Get evaluated for sleep disorders (sleep apnea).  Talk to your health care provider about getting a sleep evaluation if you snore a lot or have excessive sleepiness.  Take medicines only as directed by your health care provider.  For some people, aspirin or blood thinners (anticoagulants) are helpful in reducing the risk of forming abnormal blood clots that can lead to stroke. If you have the irregular heart rhythm of atrial fibrillation, you should be on a blood thinner unless there is a good reason you cannot take them.  Understand all your medicine instructions.  Make sure that other conditions (such as anemia or atherosclerosis) are addressed. SEEK IMMEDIATE MEDICAL CARE IF:   You have sudden weakness or numbness of the face, arm, or leg, especially on one side of the body.  Your face or eyelid droops to one side.  You have sudden confusion.  You have trouble speaking (aphasia) or understanding.  You have sudden trouble seeing in one or both eyes.  You have sudden trouble walking.  You have dizziness.  You have a loss of balance or coordination.  You have a sudden, severe headache with no known cause.  You have new chest pain or an irregular heartbeat. Any of these symptoms may represent a serious problem that is an emergency. Do not wait to see if the symptoms will   go away. Get medical help at once. Call your local emergency services (911 in [place] Do not drive yourself to the hospital.   This information is not intended to replace advice given to you by your health care provider. Make sure you discuss any questions you have with your health care provider.   Document Released: 09/10/2004 Document Revised: 08/24/2014 Document Reviewed:  02/03/2013 [employer]vier Interactive Patient Education 2016 [employer]  

## 2016-03-10 NOTE — Progress Notes (Signed)
STROKE TEAM PROGRESS NOTE    SUBJECTIVE (INTERVAL HISTORY) Her daughter is at the bedside. TEE yesterday showed a PFO - Dr. Tilden Dome discussed with Dr. Harrington Challenger - DR. Xzaiver Vayda also discussed with patient. Will check for LE VTE. 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 After working 8 hours, she got in the car and drove from [place] to [place] straight throught without stopping.     OBJECTIVE Temp:  [97.8 F (36.6 C)-98.8 F (37.1 C)] 98 F (36.7 C) (07/25 0557) Pulse Rate:  [76-90] 80 (07/25 0557) Cardiac Rhythm: Normal sinus rhythm (07/25 0745) Resp:  [14-20] 18 (07/25 0557) BP: (102-171)/(56-91) 107/56 (07/25 0557) SpO2:  [93 %-100 %] 96 % (07/25 0557)  CBC:   Recent Labs Lab 03/07/16 1815 03/07/16 1838 03/09/16 0310  WBC 7.8  --  5.5  NEUTROABS 6.1  --   --   HGB 12.0 13.9 11.3*  HCT 39.5 41.0 36.9  MCV 88.2  --  87.9  PLT 341  --  [id number]    Basic Metabolic Panel:   Recent Labs Lab 03/07/16 1815 03/07/16 1838 03/09/16 0310  NA 141 143 141  K 4.0 4.1 3.4*  CL 106 103 107  CO2 27  --  28  GLUCOSE 149* 145* 125*  BUN 11 12 10   CREATININE 1.05* 1.00 0.86  CALCIUM 9.5  --  9.0    Lipid Panel:     Component Value Date/Time   CHOL 134 03/08/2016 0421   TRIG 77 03/08/2016 0421   HDL 43 03/08/2016 0421   CHOLHDL 3.1 03/08/2016 0421   VLDL 15 03/08/2016 0421   LDLCALC 76 03/08/2016 0421   HgbA1c:  Lab Results  Component Value Date   HGBA1C 7.3 (H) 03/08/2016   Urine Drug Screen:     Component Value Date/Time   LABOPIA NONE DETECTED 03/07/2016 1815   COCAINSCRNUR NONE DETECTED 03/07/2016 1815   LABBENZ NONE DETECTED 03/07/2016 1815   AMPHETMU NONE DETECTED 03/07/2016 1815   THCU NONE DETECTED 03/07/2016 1815   LABBARB NONE DETECTED 03/07/2016 1815      IMAGING  No results found. Carotid Doppler   There is 1-39% bilateral ICA stenosis. Vertebral artery flow is antegrade.    2-D echocardiogram - Left ventricle: The cavity size was normal. There was  mild concentric hypertrophy. Systolic function was normal. The estimated ejection fraction was in the range of 50% to 55%. Wall motion was normal; there were no regional wall motion abnormalities. - Atrial septum: No defect or patent foramen ovale was identified. Impressions:  - No cardiac source of emboli was indentified.  TEE - Left atrium: No evidence of thrombus in the atrial cavity or appendage. 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   PHYSICAL EXAM Pleasant middle-aged lady currently not in distress. . Afebrile. Head is nontraumatic. Neck is supple without bruit.    Cardiac exam no murmur or gallop. Lungs are clear to auscultation. Distal pulses are well felt. Neurological Exam ;  Awake  Alert oriented x 3. Normal speech and language.eye movements full without nystagmus.fundi were not visualized. Vision acuity and fields appear normal. Hearing is normal. Palatal movements are normal. Face symmetric. Tongue midline. Normal strength, tone, reflexes and coordination. Normal sensation. Gait deferred.   ASSESSMENT/PLAN Amy Joseph  is a 55 y.o. female with history of hypertension, diabetes and obesity presenting with visual changes, slurred speech and tingling of the right face. She did not receive IV t-PA due to symptom resolution.   Stroke:  Left PCA, right cerebellar and left occipital infarcts, embolic secondary to unknown source but paradoxical embolism likely given presence of PFO and stroke happening after a long car ride.  MRI  Patchy left PCA, right cerebellar and left occipital infarcts  MRA  Left P2 occlusion  Carotid Doppler  No significant stenosis   2D Echo  EF 50-55% with no source of embolus  TEE positive for large PFO  Check LE dopplers to rule  out VTE.  LDL 76  HgbA1c 7.3  Lovenox 40 mg sq daily for VTE prophylaxis Diet Carb Modified Fluid consistency: Thin; Room service appropriate? Yes  No antithrombotic prior to admission, now on aspirin 325 mg daily  Patient counseled to be compliant with her antithrombotic medications  Ongoing aggressive stroke risk factor management  Therapy recommendations:  No therapy needs  Disposition:  Return home  Hypertension  Stable  Permissive hypertension (OK if < 220/120) but gradually normalize in 5-7 days  Long-term BP goal normotensive  Hyperlipidemia  Home meds:  No statin  LDL 76, goal < 70  Added lipitor 20 mg this hospitalization  Continue statin at discharge  Diabetes  HgbA1c 7.3, goal < 7.0  Uncontrolled  Other Stroke Risk Factors  ETOH use, advised to drink no more than 1 drink(s) a day  Morbid Obesity, Body mass index is 51.25 kg/m., recommend weight loss, diet and exercise    Other Active Problems  History of Chiari malformation with decompression  Hospital day # [place] for Pager information 03/10/2016 10:13 AM  I have personally examined this patient, reviewed notes, independently viewed imaging studies, participated in medical decision making and plan of care. I have made any additions or clarifications directly to the above note. Agree with note above. . I had a long discussion with the patient and her daughter regarding the results of her stroke workup. Patient has a large patent foramen ovale and her stroke happened after a long six-hour drive from [place] to [place] making paradoxical embolism unlikely mechanism for stroke. I discussed with her results of recent clinical trials suggesting benefit of endovascular PFO closure over a maximal medical therapy. I recommend she see her primary physician upon her return to [facility] and seek referral to a cardiologist and vascular neurologist to consider  elective endovascular PFO closure. Check lower extremity venous Dopplers home on aspirin.. Patient was advised to avoid prolonged sitting on bedrest and to get up and walk every few hours if she has a long flight or car ride.. Greater than 50% time during this 35-minute visit was spent on counseling and coordination of care about PFO, stroke risk, paradoxical embolism and stroke prevention and answering questions. Stroke team will sign off. Call for questions. Antony Contras, MD Medical Director [facility] Pager: [phone] 03/10/2016 1:23 PM  To contact Stroke Continuity provider, please refer to [email or web address] After hours, contact General Neurology

## 2016-03-16 LAB — PROTHROMBIN GENE MUTATION

## 2021-04-11 ENCOUNTER — Other Ambulatory Visit: Payer: Self-pay | Admitting: Adult Medicine

## 2021-04-11 DIAGNOSIS — Z1231 Encounter for screening mammogram for malignant neoplasm of breast: Secondary | ICD-10-CM

## 2021-04-28 ENCOUNTER — Telehealth: Payer: Self-pay | Admitting: Hematology and Oncology

## 2021-04-28 NOTE — Telephone Encounter (Signed)
Scheduled appt per 9/9 referral. Pt is aware of appt date and time.

## 2021-05-05 NOTE — Progress Notes (Signed)
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  We have discussed the following details about thrombocytosis today.  Differential diagnosis  1. Primary thrombocytosis: Related to myeloproliferative disorders of the bone marrow especially essential thrombocytosis and CML. I would like to send for BCR-ABL as well as JAK-2 mutation analysis. Patient understands that JAK2 mutation is only present in 50% of essential thrombocytosis so the test is advantageous only if it is positive. If it is negative, it does not rule out. 2. Secondary/reactive thrombocytosis Different causes including infections, inflammation, iron deficiency.  I would like to send out for iron studies with ferritin to complete the workup given her history of gastric bypass surgery.  Treatment options: 1. If it is primary essential thrombocytosis, treatment would depend on platelet count level as well as history of thrombosis. A. For low risk patients, the treatment would be with aspirin therapy B. for high risk patients, the treatment would be platelet lowering therapy with aspirin 2. Treatment of secondary thrombocytosis would be to treat underlying cause. There would not be any risk of thrombosis with secondary thrombocytosis.  Return to clinic in 3 weeks to discuss the results of these tests.   HISTORY OF PRESENTING ILLNESS:  Amy Joseph 60 y.o. female is here because of thrombocytosis.  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  Patient denies any new health complaints.  In  2017 she had a stroke which was thought to be embolic from patent PFO which has since been closed.  She was continued on anticoagulation for a while but she is currently not taking any anticoagulation or aspirin. She denies any B symptoms, erythromelalgia or intractable pruritus.  Besides the above-mentioned stroke, no history of DVT/PE.  She did have gastric bypass many years ago and continues on oral iron supplementation. Rest of the pertinent 10 point ROS reviewed and negative.  REVIEW OF SYSTEMS:   Constitutional: Denies fevers, chills or abnormal night sweats Eyes: Denies blurriness of vision, double vision or watery eyes Ears, nose, mouth, throat, and face: Denies mucositis or sore throat Respiratory: Denies cough, dyspnea or wheezes Cardiovascular: Denies palpitation, chest discomfort or lower extremity swelling Gastrointestinal:  Denies nausea, heartburn or change in bowel habits Skin: Denies abnormal skin rashes Lymphatics: Denies new lymphadenopathy or easy bruising Neurological:Denies numbness, tingling or new weaknesses Behavioral/Psych: Mood is stable, no new changes  All other systems were reviewed with the patient and are negative.  MEDICAL HISTORY:  Past Medical History:  Diagnosis Date   Diabetes mellitus without complication ([place])    Hypertension     SURGICAL HISTORY: Past Surgical History:  Procedure Laterality Date   TEE WITHOUT CARDIOVERSION N/A 03/09/2016   Procedure: TRANSESOPHAGEAL ECHOCARDIOGRAM (TEE);  Surgeon: Fay Records, MD;  Location: [facility] ENDOSCOPY;  Service: Cardiovascular;  Laterality: N/A;    SOCIAL HISTORY: Social History   Socioeconomic History   Marital status: Single    Spouse name: Not on file   Number of children: Not on file   Years of education: Not on file   Highest education level: Not on file  Occupational History   Not on file  Tobacco Use  Smoking status: Never   Smokeless tobacco: Never  Substance and Sexual Activity    Alcohol use: Yes   Drug use: No   Sexual activity: Not Currently  Other Topics Concern   Not on file  Social History Narrative   Not on file   Social Determinants of Health   Financial Resource Strain: Not on file  Food Insecurity: Not on file  Transportation Needs: Not on file  Physical Activity: Not on file  Stress: Not on file  Social Connections: Not on file  Intimate Partner Violence: Not on file    FAMILY HISTORY: No family history on file.  ALLERGIES:  is allergic to sulfa antibiotics, bee venom, codeine, penicillins, and percocet [oxycodone-acetaminophen].  MEDICATIONS:  Current Outpatient Medications  Medication Sig Dispense Refill   albuterol (PROVENTIL HFA;VENTOLIN HFA) 108 (90 Base) MCG/ACT inhaler Inhale 1-2 puffs into the lungs every 6 (six) hours as needed for wheezing or shortness of breath.     atorvastatin (LIPITOR) 20 MG tablet Take 1 tablet (20 mg total) by mouth daily at 6 PM. 30 tablet 0   BIOTIN PO Take by mouth.     bisoprolol-hydrochlorothiazide (ZIAC) 5-6.25 MG tablet Take 1 tablet by mouth daily.     Calcium Citrate-Vitamin D (CALCIUM + D PO) Take by mouth.     Dulaglutide 1.5 MG/0.5ML SOPN Inject into the skin once a week.     empagliflozin (JARDIANCE) 10 MG TABS tablet Take 10 mg by mouth daily.     ergocalciferol (VITAMIN D2) 1.25 MG (50000 UT) capsule Take by mouth.     escitalopram (LEXAPRO) 20 MG tablet      haloperidol (HALDOL) 0.5 MG tablet Take 0.5 mg by mouth at bedtime.     hydrOXYzine (ATARAX/VISTARIL) 25 MG tablet      Iron-Vit C-Vit B12-Folic Acid (IRON 123XX123 PLUS PO)      loratadine (CLARITIN) 10 MG tablet Take by mouth.     losartan (COZAAR) 100 MG tablet Take 100 mg by mouth daily.     Magnesium Citrate 200 MG TABS      metFORMIN (GLUCOPHAGE) 1000 MG tablet Take 1,000 mg by mouth 2 (two) times daily with a meal.     methocarbamol (ROBAXIN) 500 MG tablet      montelukast (SINGULAIR) 10 MG tablet Take 10 mg by mouth at bedtime.      sertraline (ZOLOFT) 50 MG tablet Take 50 mg by mouth daily.     vitamin B-12 (CYANOCOBALAMIN) 1000 MCG tablet Take 1,000 mcg by mouth daily.     Vitamins A & D (VITAMIN A & D PO) Take 1 tablet by mouth daily.     aspirin 81 MG tablet Take 1 tablet (81 mg total) by mouth daily.     CALCIUM PO Take 1 tablet by mouth daily.     Multiple Vitamins-Minerals (MULTIVITAMIN ADULTS 50+) TABS Take 1 tablet by mouth daily.     No current facility-administered medications for this visit.    PHYSICAL EXAMINATION: ECOG PERFORMANCE STATUS: 0 - Asymptomatic  Vitals:   05/06/21 1007  BP: 110/76  Pulse: 95  Resp: 17  Temp: 97.8 F (36.6 C)  SpO2: 94%   Filed Weights   05/06/21 1007  Weight: 240 lb 4.8 oz (109 kg)    GENERAL:alert, no distress and comfortable SKIN: skin color, texture, turgor are normal, no rashes or significant lesions EYES: normal, conjunctiva are pink and non-injected, sclera clear OROPHARYNX:no exudate, no erythema and lips, buccal mucosa, and tongue normal  NECK: supple, thyroid normal size, non-tender, without nodularity LYMPH:  no palpable lymphadenopathy in the cervical, axillary or inguinal LUNGS: clear to auscultation and percussion with normal breathing effort HEART: regular rate & rhythm and no murmurs and no lower extremity edema ABDOMEN:abdomen soft, non-tender and normal bowel sounds Musculoskeletal:no cyanosis of digits and no clubbing  PSYCH: alert & oriented x 3 with fluent speech NEURO: no focal motor/sensory deficits  LABORATORY DATA:  I have reviewed the data as listed Lab Results  Component Value Date   WBC 6.4 05/06/2021   HGB 11.4 (L) 05/06/2021   HCT 37.8 05/06/2021   MCV 78.3 (L) 05/06/2021   PLT 403 (H) 05/06/2021     Chemistry      Component Value Date/Time   NA 141 05/06/2021 1053   K 3.7 05/06/2021 1053   CL 106 05/06/2021 1053   CO2 26 05/06/2021 1053   BUN 14 05/06/2021 1053   CREATININE 1.13 (H) 05/06/2021 1053      Component  Value Date/Time   CALCIUM 9.8 05/06/2021 1053   ALKPHOS 92 05/06/2021 1053   AST 22 05/06/2021 1053   ALT 21 05/06/2021 1053   BILITOT 0.8 05/06/2021 1053      Labs from March 21, 2020 showed a white count of 6.17, hemoglobin of 11.2, platelet count of 4 24,000. We do not have any recent labs from PCP's office, request submitted. CBC from February 11, 2019 showed a white count of 5.33, hemoglobin of 12.3, hematocrit of 40.9 and platelet count of 350,000 Most recent labs from April 10, 2021 showed a platelet count of 500,000, no leukocytosis, hemoglobin of 11 g/dL.  No basophilia noted.  I have reviewed these most recent labs on her cell phone.  RADIOGRAPHIC STUDIES: I have personally reviewed the radiological images as listed and agreed with the findings in the report. No results found.  All questions were answered. The patient knows to call the clinic with any problems, questions or concerns. I spent 45 minutes in the care of this patient including H and P, review of records, counseling and coordination of care.     Benay Pike, MD 05/06/2021 11:41 AM

## 2021-05-06 ENCOUNTER — Telehealth: Payer: Self-pay | Admitting: Hematology and Oncology

## 2021-05-06 ENCOUNTER — Other Ambulatory Visit: Payer: Self-pay

## 2021-05-06 ENCOUNTER — Other Ambulatory Visit: Payer: Self-pay | Admitting: Hematology and Oncology

## 2021-05-06 ENCOUNTER — Inpatient Hospital Stay: Payer: 59 | Attending: Hematology and Oncology | Admitting: Hematology and Oncology

## 2021-05-06 ENCOUNTER — Inpatient Hospital Stay: Payer: 59

## 2021-05-06 ENCOUNTER — Telehealth: Payer: Self-pay

## 2021-05-06 ENCOUNTER — Encounter: Payer: Self-pay | Admitting: Hematology and Oncology

## 2021-05-06 VITALS — BP 110/76 | HR 95 | Temp 97.8°F | Resp 17 | Ht 63.0 in | Wt 240.3 lb

## 2021-05-06 DIAGNOSIS — D75839 Thrombocytosis, unspecified: Secondary | ICD-10-CM | POA: Diagnosis present

## 2021-05-06 DIAGNOSIS — Z803 Family history of malignant neoplasm of breast: Secondary | ICD-10-CM

## 2021-05-06 DIAGNOSIS — Z9884 Bariatric surgery status: Secondary | ICD-10-CM | POA: Diagnosis not present

## 2021-05-06 DIAGNOSIS — Z79899 Other long term (current) drug therapy: Secondary | ICD-10-CM | POA: Insufficient documentation

## 2021-05-06 LAB — COMPREHENSIVE METABOLIC PANEL
ALT: 21 U/L (ref 0–44)
AST: 22 U/L (ref 15–41)
Albumin: 3.6 g/dL (ref 3.5–5.0)
Alkaline Phosphatase: 92 U/L (ref 38–126)
Anion gap: 9 (ref 5–15)
BUN: 14 mg/dL (ref 8–23)
CO2: 26 mmol/L (ref 22–32)
Calcium: 9.8 mg/dL (ref 8.9–10.3)
Chloride: 106 mmol/L (ref 98–111)
Creatinine, Ser: 1.13 mg/dL — ABNORMAL HIGH (ref 0.44–1.00)
GFR, Estimated: 55 mL/min — ABNORMAL LOW (ref >60)
Glucose, Bld: 115 mg/dL — ABNORMAL HIGH (ref 70–99)
Potassium: 3.7 mmol/L (ref 3.5–5.1)
Sodium: 141 mmol/L (ref 135–145)
Total Bilirubin: 0.8 mg/dL (ref 0.3–1.2)
Total Protein: 7.2 g/dL (ref 6.5–8.1)

## 2021-05-06 LAB — CBC WITH DIFFERENTIAL/PLATELET
Abs Immature Granulocytes: 0.01 10*3/uL (ref 0.00–0.07)
Basophils Absolute: 0 10*3/uL (ref 0.0–0.1)
Basophils Relative: 1 %
Eosinophils Absolute: 0.2 10*3/uL (ref 0.0–0.5)
Eosinophils Relative: 3 %
HCT: 37.8 % (ref 36.0–46.0)
Hemoglobin: 11.4 g/dL — ABNORMAL LOW (ref 12.0–15.0)
Immature Granulocytes: 0 %
Lymphocytes Relative: 33 %
Lymphs Abs: 2.1 10*3/uL (ref 0.7–4.0)
MCH: 23.6 pg — ABNORMAL LOW (ref 26.0–34.0)
MCHC: 30.2 g/dL (ref 30.0–36.0)
MCV: 78.3 fL — ABNORMAL LOW (ref 80.0–100.0)
Monocytes Absolute: 0.5 10*3/uL (ref 0.1–1.0)
Monocytes Relative: 8 %
Neutro Abs: 3.6 10*3/uL (ref 1.7–7.7)
Neutrophils Relative %: 55 %
Platelets: 403 10*3/uL — ABNORMAL HIGH (ref 150–400)
RBC: 4.83 MIL/uL (ref 3.87–5.11)
RDW: 18.2 % — ABNORMAL HIGH (ref 11.5–15.5)
WBC: 6.4 10*3/uL (ref 4.0–10.5)
nRBC: 0 % (ref 0.0–0.2)

## 2021-05-06 LAB — FERRITIN: Ferritin: 12 ng/mL (ref 11–307)

## 2021-05-06 LAB — IRON AND TIBC
Iron: 22 ug/dL — ABNORMAL LOW (ref 41–142)
Saturation Ratios: 5 % — ABNORMAL LOW (ref 21–57)
TIBC: 394 ug/dL (ref 236–444)
UIBC: 372 ug/dL (ref 120–384)

## 2021-05-06 NOTE — Telephone Encounter (Signed)
Scheduled genetics appt per 9/20 referral from Dr. Chryl Heck. Pt is aware of appt date and time.

## 2021-05-06 NOTE — Telephone Encounter (Signed)
Called and spoke with patient. Informed her that, per Dr Chryl Heck, iron level is low and may be contributing to thrombocytosis. Encouraged patient to take 65mg  of ferrous sulfate once a day. Informed her of constipation/dark stool side effects. Patient verbalized understanding.

## 2021-05-06 NOTE — Progress Notes (Signed)
Genetics referral placed.

## 2021-05-07 ENCOUNTER — Other Ambulatory Visit: Payer: Self-pay | Admitting: Hematology and Oncology

## 2021-05-07 DIAGNOSIS — D75839 Thrombocytosis, unspecified: Secondary | ICD-10-CM

## 2021-05-07 LAB — ERYTHROPOIETIN: Erythropoietin: 61.7 m[IU]/mL — ABNORMAL HIGH (ref 2.6–18.5)

## 2021-05-13 LAB — JAK2 (INCLUDING V617F AND EXON 12), MPL,& CALR W/RFL MPN PANEL (NGS)

## 2021-05-14 ENCOUNTER — Ambulatory Visit (HOSPITAL_COMMUNITY): Payer: 59

## 2021-05-15 ENCOUNTER — Other Ambulatory Visit: Payer: Self-pay | Admitting: Genetic Counselor

## 2021-05-15 ENCOUNTER — Inpatient Hospital Stay: Payer: 59

## 2021-05-15 ENCOUNTER — Other Ambulatory Visit: Payer: Self-pay

## 2021-05-15 ENCOUNTER — Inpatient Hospital Stay (HOSPITAL_BASED_OUTPATIENT_CLINIC_OR_DEPARTMENT_OTHER): Payer: 59 | Admitting: Genetic Counselor

## 2021-05-15 DIAGNOSIS — Z803 Family history of malignant neoplasm of breast: Secondary | ICD-10-CM

## 2021-05-15 DIAGNOSIS — D75839 Thrombocytosis, unspecified: Secondary | ICD-10-CM | POA: Diagnosis not present

## 2021-05-15 LAB — CBC WITH DIFFERENTIAL/PLATELET
Abs Immature Granulocytes: 0.01 10*3/uL (ref 0.00–0.07)
Basophils Absolute: 0 10*3/uL (ref 0.0–0.1)
Basophils Relative: 1 %
Eosinophils Absolute: 0.2 10*3/uL (ref 0.0–0.5)
Eosinophils Relative: 3 %
HCT: 40 % (ref 36.0–46.0)
Hemoglobin: 11.7 g/dL — ABNORMAL LOW (ref 12.0–15.0)
Immature Granulocytes: 0 %
Lymphocytes Relative: 34 %
Lymphs Abs: 1.9 10*3/uL (ref 0.7–4.0)
MCH: 22.9 pg — ABNORMAL LOW (ref 26.0–34.0)
MCHC: 29.3 g/dL — ABNORMAL LOW (ref 30.0–36.0)
MCV: 78.4 fL — ABNORMAL LOW (ref 80.0–100.0)
Monocytes Absolute: 0.4 10*3/uL (ref 0.1–1.0)
Monocytes Relative: 6 %
Neutro Abs: 3.1 10*3/uL (ref 1.7–7.7)
Neutrophils Relative %: 56 %
Platelets: 479 10*3/uL — ABNORMAL HIGH (ref 150–400)
RBC: 5.1 MIL/uL (ref 3.87–5.11)
RDW: 18.1 % — ABNORMAL HIGH (ref 11.5–15.5)
WBC: 5.6 10*3/uL (ref 4.0–10.5)
nRBC: 0 % (ref 0.0–0.2)

## 2021-05-15 LAB — COMPREHENSIVE METABOLIC PANEL
ALT: 24 U/L (ref 0–44)
AST: 22 U/L (ref 15–41)
Albumin: 3.8 g/dL (ref 3.5–5.0)
Alkaline Phosphatase: 92 U/L (ref 38–126)
Anion gap: 11 (ref 5–15)
BUN: 18 mg/dL (ref 8–23)
CO2: 25 mmol/L (ref 22–32)
Calcium: 9.7 mg/dL (ref 8.9–10.3)
Chloride: 106 mmol/L (ref 98–111)
Creatinine, Ser: 1.12 mg/dL — ABNORMAL HIGH (ref 0.44–1.00)
GFR, Estimated: 56 mL/min — ABNORMAL LOW (ref >60)
Glucose, Bld: 108 mg/dL — ABNORMAL HIGH (ref 70–99)
Potassium: 4.2 mmol/L (ref 3.5–5.1)
Sodium: 142 mmol/L (ref 135–145)
Total Bilirubin: 0.5 mg/dL (ref 0.3–1.2)
Total Protein: 7.6 g/dL (ref 6.5–8.1)

## 2021-05-16 ENCOUNTER — Encounter: Payer: Self-pay | Admitting: Genetic Counselor

## 2021-05-16 DIAGNOSIS — Z803 Family history of malignant neoplasm of breast: Secondary | ICD-10-CM

## 2021-05-16 HISTORY — DX: Family history of malignant neoplasm of breast: Z80.3

## 2021-05-16 LAB — GENETIC SCREENING ORDER

## 2021-05-16 NOTE — Progress Notes (Addendum)
REFERRING PROVIDER: Benay Pike, MD [place],  [place] [address]  PRIMARY PROVIDER:  Dulce Sellar, MD  PRIMARY REASON FOR VISIT:  1. Family history of breast cancer     HISTORY OF PRESENT ILLNESS:   Amy Joseph, a 60 y.o. female, was seen for a [place] cancer genetics consultation at the request of Dr. Chryl Heck due to a family history of cancer.  Amy Joseph presents to clinic today to discuss the possibility of a hereditary predisposition to cancer, to discuss genetic testing, and to further clarify her future cancer risks, as well as potential cancer risks for family members.   Amy Joseph is a 60 y.o. female with no personal history of cancer.    CANCER HISTORY:  Oncology History   No history exists.    RISK FACTORS:  Menarche was at age 16.  First live birth at age 61.  OCP use for approximately  20  years.  Ovaries intact: yes/ Hysterectomy: no.  Menopausal status: postmenopausal, age 30 HRT use: 0 years. Colonoscopy: yes;  most recent at age 66 . Mammogram within the last year: no; scheduled for October 2022 Number of breast biopsies: 0. Up to date with pelvic exams: no. Any excessive radiation exposure in the past: no  Past Medical History:  Diagnosis Date   Diabetes mellitus without complication ([place])    Family history of breast cancer 05/16/2021   Hypertension     Past Surgical History:  Procedure Laterality Date   TEE WITHOUT CARDIOVERSION N/A 03/09/2016   Procedure: TRANSESOPHAGEAL ECHOCARDIOGRAM (TEE);  Surgeon: Fay Records, MD;  Location: [facility] ENDOSCOPY;  Service: Cardiovascular;  Laterality: N/A;    Social History   Socioeconomic History   Marital status: Single    Spouse name: Not on file   Number of children: Not on file   Years of education: Not on file   Highest education level: Not on file  Occupational History   Not on file  Tobacco Use   Smoking status: Never   Smokeless tobacco: Never  Substance and Sexual Activity    Alcohol use: Yes   Drug use: No   Sexual activity: Not Currently  Other Topics Concern   Not on file  Social History Narrative   Not on file   Social Determinants of Health   Financial Resource Strain: Not on file  Food Insecurity: Not on file  Transportation Needs: Not on file  Physical Activity: Not on file  Stress: Not on file  Social Connections: Not on file     FAMILY HISTORY:  We obtained a detailed, 4-generation family history.  Significant diagnoses are listed below: Family History  Problem Relation Age of Onset   Breast cancer Mother        dx 48s; d. 44   Cancer Maternal Grandmother        unknown type; ? ovarian; d. 46-50s     Amy Joseph is unaware of previous family history of genetic testing for hereditary cancer risks. There is no reported Ashkenazi Jewish ancestry. There is no known consanguinity.  GENETIC COUNSELING ASSESSMENT: Amy Joseph is a 60 y.o. female with a family history of cancer which is somewhat suggestive of a hereditary cancer syndrome and predisposition to cancer given her age of diagnosis. We, therefore, discussed and recommended the following at today's visit.   DISCUSSION: We discussed that 5 - 10% of cancer is hereditary, with most cases of hereditary breast cancer associated with mutations in BRCA1/2.  There  are other genes that can be associated with hereditary breast cancer syndromes.  Type of cancer risk and level of risk are gene-specific.  We discussed that testing is beneficial for several reasons, including knowing about other cancer risks, identifying potential screening and risk-reduction options that may be appropriate, and to understanding if other family members could be at risk for cancer and allowing them to undergo genetic testing.  We reviewed the characteristics, features and inheritance patterns of hereditary cancer syndromes. We also discussed genetic testing, including the appropriate family members to test, the process of  testing, insurance coverage and turn-around-time for results. We discussed the implications of a negative, positive, carrier and/or variant of uncertain significant result. We discussed that negative results would be uninformative given that Amy Joseph does not have a personal history of cancer. We recommended Amy Joseph pursue genetic testing for a panel that contains genes associated with breast.  Amy Joseph was offered a common hereditary cancer panel (47 genes) and an expanded pan-cancer panel (84 genes). Amy Joseph was informed of the benefits and limitations of each panel, including that expanded pan-cancer panels contain several genes that do not have clear management guidelines at this point in time.  We also discussed that as the number of genes included on a panel increases, the chances of variants of uncertain significance increases.  After considering the benefits and limitations of each gene panel, Amy Joseph elected to have an expanded pan-cancer panel through [facility].   The Multi-Cancer + RNA Panel offered by [facility] includes sequencing and/or deletion/duplication analysis of the following 84 genes:  AIP*, ALK, APC*, ATM*, AXIN2*, BAP1*, BARD1*, BLM*, BMPR1A*, BRCA1*, BRCA2*, BRIP1*, CASR, CDC73*, CDH1*, CDK4, CDKN1B*, CDKN1C*, CDKN2A, CEBPA, CHEK2*, CTNNA1*, DICER1*, DIS3L2*, EGFR, EPCAM, FH*, FLCN*, GATA2*, GPC3, GREM1, HOXB13, HRAS, KIT, MAX*, MEN1*, MET, MITF, MLH1*, MSH2*, MSH3*, MSH6*, MUTYH*, NBN*, NF1*, NF2*, NTHL1*, PALB2*, PDGFRA, PHOX2B, PMS2*, POLD1*, POLE*, POT1*, PRKAR1A*, PTCH1*, PTEN*, RAD50*, RAD51C*, RAD51D*, RB1*, RECQL4, RET, RUNX1*, SDHA*, SDHAF2*, SDHB*, SDHC*, SDHD*, SMAD4*, SMARCA4*, SMARCB1*, SMARCE1*, STK11*, SUFU*, TERC, TERT, TMEM127*, Tp53*, TSC1*, TSC2*, VHL*, WRN*, and WT1.  RNA analysis is performed for * genes.  Based on Amy Joseph's family history of cancer, she meets medical criteria for genetic testing. Despite that she meets criteria, she may still have  an out of pocket cost. We discussed that if she has an out of pocket cost, the laboratory should reach out to her to discuss self-pay options and/or patient pay assistance programs.   We discussed that some people do not want to undergo genetic testing due to fear of genetic discrimination.  A federal law called the Genetic Information Non-Discrimination Act (GINA) of 2008 helps protect individuals against genetic discrimination based on their genetic test results.  It impacts both health insurance and employment.  With health insurance, it protects against increased premiums, being kicked off insurance or being forced to take a test in order to be insured.  For employment it protects against hiring, firing and promoting decisions based on genetic test results.  GINA does not apply to those in the [employer], those who work for companies with less than 15 employees, and new life insurance or long-term disability insurance policies.  Health status due to a cancer diagnosis is not protected under GINA.  PLAN: After considering the risks, benefits, and limitations, Amy Joseph provided informed consent to pursue genetic testing and the blood sample was sent to [facility] for analysis of the Multi-Cancer +RNA Panel. Results should be available within approximately  3 weeks' time, at which point they will be disclosed by telephone to Amy Joseph, as will any additional recommendations warranted by these results. Amy Joseph will receive a summary of her genetic counseling visit and a copy of her results once available. This information will also be available in Epic.   Lastly, we encouraged Amy Joseph to remain in contact with cancer genetics annually so that we can continuously update the family history and inform her of any changes in cancer genetics and testing that may be of benefit for this family.   Amy Joseph questions were answered to her satisfaction today. Our contact information was provided  should additional questions or concerns arise. Thank you for the referral and allowing [place] to share in the care of your patient.   Boysie Bonebrake M. Joette Catching, [place], [place] [facility] Genetic Counselor [email or web address] (P) [phone]   The patient was seen for a total of 35 minutes in face-to-face genetic counseling.  The patient was seen alone.  Drs. Magrinat, Lindi Adie and/or Burr Medico were available to discuss this case as needed.  _______________________________________________________________________ For Office Staff:  Number of people involved in session: 1 Was an Intern/ student involved with case: no

## 2021-05-20 ENCOUNTER — Inpatient Hospital Stay: Payer: 59 | Attending: Hematology and Oncology | Admitting: Hematology and Oncology

## 2021-05-20 DIAGNOSIS — Z803 Family history of malignant neoplasm of breast: Secondary | ICD-10-CM | POA: Insufficient documentation

## 2021-05-20 DIAGNOSIS — D75839 Thrombocytosis, unspecified: Secondary | ICD-10-CM | POA: Insufficient documentation

## 2021-05-20 DIAGNOSIS — D509 Iron deficiency anemia, unspecified: Secondary | ICD-10-CM | POA: Insufficient documentation

## 2021-05-20 DIAGNOSIS — K909 Intestinal malabsorption, unspecified: Secondary | ICD-10-CM | POA: Insufficient documentation

## 2021-05-20 DIAGNOSIS — Z9884 Bariatric surgery status: Secondary | ICD-10-CM | POA: Insufficient documentation

## 2021-05-20 DIAGNOSIS — Z809 Family history of malignant neoplasm, unspecified: Secondary | ICD-10-CM | POA: Insufficient documentation

## 2021-05-21 ENCOUNTER — Other Ambulatory Visit: Payer: Self-pay

## 2021-05-21 ENCOUNTER — Telehealth: Payer: Self-pay | Admitting: Hematology and Oncology

## 2021-05-21 ENCOUNTER — Ambulatory Visit (HOSPITAL_COMMUNITY)
Admission: RE | Admit: 2021-05-21 | Discharge: 2021-05-21 | Disposition: A | Discharge status: Home / Self Care | Payer: 59 | Source: Ambulatory Visit | Attending: Hematology and Oncology | Admitting: Hematology and Oncology

## 2021-05-21 DIAGNOSIS — D75839 Thrombocytosis, unspecified: Secondary | ICD-10-CM | POA: Diagnosis present

## 2021-05-21 NOTE — Telephone Encounter (Signed)
Per 10/4 sch msg, appt was rescheduled and msg left with pt

## 2021-05-23 ENCOUNTER — Inpatient Hospital Stay: Payer: 59 | Admitting: Hematology and Oncology

## 2021-05-28 ENCOUNTER — Other Ambulatory Visit: Payer: Self-pay

## 2021-05-28 ENCOUNTER — Inpatient Hospital Stay (HOSPITAL_BASED_OUTPATIENT_CLINIC_OR_DEPARTMENT_OTHER): Payer: 59 | Admitting: Hematology and Oncology

## 2021-05-28 VITALS — BP 130/76 | HR 88 | Temp 97.1°F | Resp 18 | Wt 243.6 lb

## 2021-05-28 DIAGNOSIS — D508 Other iron deficiency anemias: Secondary | ICD-10-CM

## 2021-05-28 DIAGNOSIS — Z803 Family history of malignant neoplasm of breast: Secondary | ICD-10-CM | POA: Diagnosis not present

## 2021-05-28 DIAGNOSIS — D509 Iron deficiency anemia, unspecified: Secondary | ICD-10-CM

## 2021-05-28 DIAGNOSIS — K909 Intestinal malabsorption, unspecified: Secondary | ICD-10-CM | POA: Diagnosis not present

## 2021-05-28 DIAGNOSIS — D75839 Thrombocytosis, unspecified: Secondary | ICD-10-CM | POA: Diagnosis not present

## 2021-05-28 DIAGNOSIS — Z809 Family history of malignant neoplasm, unspecified: Secondary | ICD-10-CM | POA: Diagnosis not present

## 2021-05-28 DIAGNOSIS — Z9884 Bariatric surgery status: Secondary | ICD-10-CM | POA: Diagnosis not present

## 2021-05-28 NOTE — Assessment & Plan Note (Signed)
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  She was microcytic hypochromic with mild anemia and ferritin of 12.  Recommended that she start oral iron supplementation, ferrous sulfate 65 mg/325 mg p.o. twice a day.  We will plan to repeat labs in about 8 weeks to review CBC as well as iron panel with ferritin She is currently taking some form of iron supplementation but she is not quite sure if this has enough iron.  I have asked her to discontinue that particular iron formulary and transition to ferrous sulfate.

## 2021-05-28 NOTE — Progress Notes (Signed)
[place] [place] CONSULT NOTE  Patient Care Team: Dulce Sellar, MD as PCP - General (General Practice)  CHIEF COMPLAINTS/PURPOSE OF CONSULTATION:   Thrombocytosis  ASSESSMENT & PLAN:   This is a very pleasant 60 year old female patient referred to hematology for evaluation of persistent and progressive thrombocytosis.    Thrombocytosis This is a very pleasant 60 yr old female patient with thrombocytosis referred to hematology for further evaluation. JAK2 panel neg, epo levels high, US abdomen with no lesions to explain high epo. This could be secondary to IDA.  Recommended ferrous sulfate 65 mg PO BID for 8 weeks and RTC for follow up to repeat labs , CBC, iron panel and ferritin. If thrombocytosis continues to progress, we will consider bone marrow aspiration and biopsy.  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  She was microcytic hypochromic with mild anemia and ferritin of 12.  Recommended that she start oral iron supplementation, ferrous sulfate 65 mg/325 mg p.o. twice a day.  We will plan to repeat labs in about 8 weeks to review CBC as well as iron panel with ferritin She is currently taking some form of iron supplementation but she is not quite sure if this has enough iron.  I have asked her to discontinue that particular iron formulary and transition to ferrous sulfate.   HISTORY OF PRESENTING ILLNESS:  Amy Joseph 60 y.o. female is here because of thrombocytosis.  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    Interval history  Patient is here for follow-up.   She denies any new health complaints.  Since last visit she had  an ultrasound of her abdomen which did not show any kidney lesions. No B symptoms. She does not use any aspirin.   No change in breathing or bowel habits or urinary habits.   No new neurological complaints. Rest of the pertinent 10 point ROS reviewed and negative.  MEDICAL HISTORY:  Past Medical History:  Diagnosis Date   Diabetes mellitus without complication ([place])    Family history of breast cancer 05/16/2021   Hypertension     SURGICAL HISTORY: Past Surgical History:  Procedure Laterality Date   TEE WITHOUT CARDIOVERSION N/A 03/09/2016   Procedure: TRANSESOPHAGEAL ECHOCARDIOGRAM (TEE);  Surgeon: Fay Records, MD;  Location: [facility] ENDOSCOPY;  Service: Cardiovascular;  Laterality: N/A;    SOCIAL HISTORY: Social History   Socioeconomic History   Marital status: Single    Spouse name: Not on file   Number of children: Not on file   Years of education: Not on file   Highest education level: Not on file  Occupational History   Not on file  Tobacco Use   Smoking status: Never   Smokeless tobacco: Never  Substance and Sexual Activity   Alcohol use: Yes   Drug use: No   Sexual activity: Not Currently  Other Topics Concern   Not on file  Social History Narrative   Not on file   Social Determinants of Health   Financial Resource Strain: Not on file  Food Insecurity: Not on file  Transportation Needs: Not on file  Physical Activity: Not on file  Stress: Not on file  Social Connections: Not on file  Intimate Partner Violence: Not on file    FAMILY HISTORY: Family History  Problem Relation Age of Onset   Breast cancer Mother        dx 47s; d. 35   Cancer Maternal Grandmother        unknown type; ? ovarian; d. 95-50s    ALLERGIES:  is allergic to sulfa antibiotics, bee venom, codeine, penicillins, and percocet [oxycodone-acetaminophen].  MEDICATIONS:  Current Outpatient Medications  Medication Sig Dispense Refill   albuterol (PROVENTIL HFA;VENTOLIN HFA) 108 (90  Base) MCG/ACT inhaler Inhale 1-2 puffs into the lungs every 6 (six) hours as needed for wheezing or shortness of breath.     atorvastatin (LIPITOR) 20 MG tablet Take 1 tablet (20 mg total) by mouth daily at 6 PM. 30 tablet 0   BIOTIN PO Take by mouth.     bisoprolol-hydrochlorothiazide (ZIAC) 5-6.25 MG tablet Take 1 tablet by mouth daily.     Calcium Citrate-Vitamin D (CALCIUM + D PO) Take by mouth.     Dulaglutide 1.5 MG/0.5ML SOPN Inject into the skin once a week.     empagliflozin (JARDIANCE) 10 MG TABS tablet Take 10 mg by mouth daily.     ergocalciferol (VITAMIN D2) 1.25 MG (50000 UT) capsule Take by mouth.     escitalopram (LEXAPRO) 20 MG tablet      haloperidol (HALDOL) 0.5 MG tablet Take 0.5 mg by mouth at bedtime.     hydrOXYzine (ATARAX/VISTARIL) 25 MG tablet      Iron-Vit C-Vit B12-Folic Acid (IRON 469 PLUS PO)      loratadine (CLARITIN) 10 MG tablet Take by mouth.     losartan (COZAAR) 100 MG tablet Take 100 mg by mouth daily.     Magnesium Citrate 200 MG TABS      metFORMIN (GLUCOPHAGE) 1000 MG tablet Take 1,000 mg by mouth 2 (two) times daily with a meal.     methocarbamol (ROBAXIN) 500 MG tablet      montelukast (SINGULAIR) 10 MG tablet Take 10 mg by mouth at bedtime.     sertraline (ZOLOFT) 50 MG tablet Take 50 mg by mouth daily.     vitamin B-12 (CYANOCOBALAMIN) 1000 MCG tablet Take 1,000 mcg by mouth daily.     Vitamins A & D (VITAMIN A & D PO) Take 1 tablet by mouth daily.     No current facility-administered medications for this visit.    PHYSICAL EXAMINATION: ECOG PERFORMANCE STATUS: 0 - Asymptomatic  Vitals:   05/28/21 1149  BP: 130/76  Pulse: 88  Resp: 18  Temp: (!) 97.1 F (36.2 C)  SpO2: 99%   Filed Weights   05/28/21 1149  Weight: 243 lb 9 oz (110.5 kg)   PE deferred in lieu of counseling.  LABORATORY DATA:  I have reviewed the data as listed Lab Results  Component Value Date   WBC 5.6 05/15/2021   HGB 11.7 (L) 05/15/2021   HCT 40.0  05/15/2021   MCV 78.4 (L) 05/15/2021   PLT 479 (H) 05/15/2021     Chemistry      Component Value Date/Time   NA 142 05/15/2021 1111   K 4.2 05/15/2021 1111   CL 106 05/15/2021 1111   CO2 25 05/15/2021 1111   BUN 18 05/15/2021 1111   CREATININE 1.12 (H) 05/15/2021 1111      Component Value Date/Time   CALCIUM 9.7 05/15/2021 1111   ALKPHOS 92 05/15/2021 1111   AST 22 05/15/2021 1111   ALT 24 05/15/2021 1111  BILITOT 0.5 05/15/2021 1111      Labs from March 21, 2020 showed a white count of 6.17, hemoglobin of 11.2, platelet count of 4 24,000. We do not have any recent labs from PCP's office, request submitted. CBC from February 11, 2019 showed a white count of 5.33, hemoglobin of 12.3, hematocrit of 40.9 and platelet count of 350,000 Most recent labs from April 10, 2021 showed a platelet count of 500,000, no leukocytosis, hemoglobin of 11 g/dL.  No basophilia noted.  I have reviewed these most recent labs on her cell phone. CBC from 05/16/2019 , microcytic hypochromic anemia, thrombocytosis and iron deficiency.  RADIOGRAPHIC STUDIES: I have personally reviewed the radiological images as listed and agreed with the findings in the report. US Abdomen Complete  Result Date: 05/22/2021 CLINICAL DATA:  Thrombocytosis. EXAM: ABDOMEN ULTRASOUND COMPLETE COMPARISON:  None. FINDINGS: Gallbladder: No gallstones or wall thickening visualized. No sonographic Murphy sign noted by sonographer. Common bile duct: Diameter: 6 mm, normal. Liver: No focal lesion identified. Mildly increased in parenchymal echogenicity. Portal vein is patent on color Doppler imaging with normal direction of blood flow towards the liver. IVC: No abnormality visualized. Pancreas: Visualized portion unremarkable. Spleen: Size and appearance within normal limits. Right Kidney: Length: 12.5 cm. Echogenicity within normal limits. No mass or hydronephrosis visualized. Left Kidney: Length: 8.3 cm. Echogenicity within normal limits. No  mass or hydronephrosis visualized. 8 mm shadowing calculus. Abdominal aorta: No aneurysm visualized. Other findings: None. IMPRESSION: 1. Mildly increased liver parenchymal echogenicity, suggestive of underlying liver disease or steatosis. 2. Left nephrolithiasis. Electronically Signed   By: Titus Dubin M.D.   On: 05/22/2021 14:25    All questions were answered. The patient knows to call the clinic with any problems, questions or concerns. I spent 20 minutes in the care of this patient including H and P, review of records, counseling and coordination of care.     Benay Pike, MD 05/28/2021 1:31 PM

## 2021-05-28 NOTE — Assessment & Plan Note (Addendum)
This is a very pleasant 60 yr old female patient with thrombocytosis referred to hematology for further evaluation. JAK2 panel neg, epo levels high, US abdomen with no lesions to explain high epo. This could be secondary to IDA.  Recommended ferrous sulfate 65 mg PO BID for 8 weeks and RTC for follow up to repeat labs , CBC, iron panel and ferritin. If thrombocytosis continues to progress, we will consider bone marrow aspiration and biopsy.

## 2021-06-03 ENCOUNTER — Ambulatory Visit: Payer: 59

## 2021-06-06 ENCOUNTER — Telehealth: Payer: Self-pay | Admitting: Genetic Counselor

## 2021-06-06 ENCOUNTER — Encounter: Payer: Self-pay | Admitting: Genetic Counselor

## 2021-06-06 ENCOUNTER — Ambulatory Visit: Payer: Self-pay | Admitting: Genetic Counselor

## 2021-06-06 DIAGNOSIS — Z803 Family history of malignant neoplasm of breast: Secondary | ICD-10-CM

## 2021-06-06 DIAGNOSIS — Z1379 Encounter for other screening for genetic and chromosomal anomalies: Secondary | ICD-10-CM

## 2021-06-06 NOTE — Progress Notes (Signed)
HPI:   Ms. Sieben was previously seen in the [facility] clinic due to a family history of cancer and concerns regarding a hereditary predisposition to cancer. Please refer to our prior cancer genetics clinic note for more information regarding our discussion, assessment and recommendations, at the time. Ms. Patchell recent genetic test results were disclosed to her, as were recommendations warranted by these results. These results and recommendations are discussed in more detail below.  CANCER HISTORY:  Oncology History   No history exists.    FAMILY HISTORY:  We obtained a detailed, 4-generation family history.  Significant diagnoses are listed below: Family History  Problem Relation Age of Onset   Breast cancer Mother        dx 68s; d. 56   Cancer Maternal Grandmother        unknown type; ? ovarian; d. 43-50s     GENETIC TEST RESULTS:  The [facility] Multi-Cancer +RNA Panel found no pathogenic mutations. The Multi-Cancer + RNA Panel offered by [facility] includes sequencing and/or deletion/duplication analysis of the following 84 genes:  AIP*, ALK, APC*, ATM*, AXIN2*, BAP1*, BARD1*, BLM*, BMPR1A*, BRCA1*, BRCA2*, BRIP1*, CASR, CDC73*, CDH1*, CDK4, CDKN1B*, CDKN1C*, CDKN2A, CEBPA, CHEK2*, CTNNA1*, DICER1*, DIS3L2*, EGFR, EPCAM, FH*, FLCN*, GATA2*, GPC3, GREM1, HOXB13, HRAS, KIT, MAX*, MEN1*, MET, MITF, MLH1*, MSH2*, MSH3*, MSH6*, MUTYH*, NBN*, NF1*, NF2*, NTHL1*, PALB2*, PDGFRA, PHOX2B, PMS2*, POLD1*, POLE*, POT1*, PRKAR1A*, PTCH1*, PTEN*, RAD50*, RAD51C*, RAD51D*, RB1*, RECQL4, RET, RUNX1*, SDHA*, SDHAF2*, SDHB*, SDHC*, SDHD*, SMAD4*, SMARCA4*, SMARCB1*, SMARCE1*, STK11*, SUFU*, TERC, TERT, TMEM127*, Tp53*, TSC1*, TSC2*, VHL*, WRN*, and WT1.  RNA analysis is performed for * genes.  The test report has been scanned into EPIC and is located under the Molecular Pathology section of the Results Review tab.  A portion of the result report is included below for reference. Genetic  testing reported out on June 03, 2021.       Even though a pathogenic variant was not identified, possible explanations for the cancer in the family may include: There may be no hereditary risk for cancer in the family. The cancers in Ms. Grissett's family may be due to other genetic or environmental factors. There may be a gene mutation in one of these genes that current testing methods cannot detect but that chance is small. There could be another gene that has not yet been discovered, or that we have not yet tested, that is responsible for the cancer diagnoses in the family.  It is also possible there is a hereditary cause for the cancer in the family that Ms. Bartol did not inherit.  Therefore, it is important to remain in touch with cancer genetics in the future so that we can continue to offer Ms. Usery the most up to date genetic testing.   ADDITIONAL GENETIC TESTING:  We discussed with Ms. Heatherly that her genetic testing was fairly extensive.  If there are genes identified to increase cancer risk that can be analyzed in the future, we would be happy to discuss and coordinate this testing at that time.    CANCER SCREENING RECOMMENDATIONS:  Ms. Kallio test result is considered negative (normal).  This means that we have not identified a hereditary cause for her family history of cancer at this time.   An individual's cancer risk and medical management are not determined by genetic test results alone. Overall cancer risk assessment incorporates additional factors, including personal medical history, family history, and any available genetic information that may result in a personalized plan  for cancer prevention and surveillance. Therefore, it is recommended she continue to follow the cancer management and screening guidelines provided by her primary healthcare provider.  RECOMMENDATIONS FOR FAMILY MEMBERS:   Since she a mutation in a cancer predisposition gene included on this  panel was not detected, her children could not have inherited a known mutation from her in one of these genes. Individuals in this family might be at some increased risk of developing cancer, over the general population risk, due to the family history of cancer. We recommend women in this family have a yearly mammogram beginning at age 50, or 58 years younger than the earliest onset of cancer, an annual clinical breast exam, and perform monthly breast self-exams.  Individuals should inform their family members of the cancer history in the family.   Other members of the family may still carry a pathogenic variant in one of these genes that Ms. Mcinroy did not inherit. Based on the family history of breast cancer in her deceased mother, we recommend first degree relatives of her mother have genetic counseling and testing. Ms. Calandra will let us know if we can be of any assistance in coordinating genetic counseling and/or testing for these family members.     FOLLOW-UP:  Lastly, we discussed with Ms. Rempel that cancer genetics is a rapidly advancing field and it is possible that new genetic tests will be appropriate for her and/or her family members in the future. We encouraged her to remain in contact with cancer genetics on an annual basis so we can update her personal and family histories and let her know of advances in cancer genetics that may benefit this family.   Our contact number was provided. Ms. Bhargava questions were answered to her satisfaction, and she knows she is welcome to call us at anytime with additional questions or concerns.   Dilyn Osoria M. Joette Catching, [place], [facility] Genetic Counselor [email or web address] (P) [phone]

## 2021-06-06 NOTE — Telephone Encounter (Signed)
Revealed negative genetic testing.  Discussed that we do not know why there is cancer in the family. It could be sporadic/familial, due to a change in a gene that she did not inherit, due to a different gene that we are not testing, or maybe our current technology may not be able to pick something up.  It will be important for her to keep in contact with genetics to keep up with whether additional testing may be needed.

## 2021-06-11 ENCOUNTER — Other Ambulatory Visit: Payer: Self-pay

## 2021-06-11 ENCOUNTER — Ambulatory Visit
Admission: RE | Admit: 2021-06-11 | Discharge: 2021-06-11 | Disposition: A | Discharge status: Home / Self Care | Payer: 59 | Source: Ambulatory Visit | Attending: Adult Medicine | Admitting: Adult Medicine

## 2021-06-11 DIAGNOSIS — Z1231 Encounter for screening mammogram for malignant neoplasm of breast: Secondary | ICD-10-CM

## 2021-08-27 NOTE — Progress Notes (Signed)
[place] CONSULT NOTE  Patient Care Team: Dulce Sellar, MD as PCP - General (General Practice)  CHIEF COMPLAINTS/PURPOSE OF CONSULTATION:   Thrombocytosis  ASSESSMENT & PLAN:   This is a very pleasant 61 year old female patient referred to hematology for evaluation of persistent and progressive thrombocytosis.    Thrombocytosis This is a very pleasant 62 year old female patient with past medical history significant for diabetes, hypertension referred to hematology for evaluation of persistent and progressive thrombocytosis.  Since last visit patient has been taking her iron diligently.  Today she had no concerns except for a broken left great toe after mechanical fall.  Physical examination unremarkable, no palpable lymphadenopathy or hepatosplenomegaly. I have reviewed his CBC which showed correction of thrombocytosis.  Iron panel and ferritin are pending.  At this time since her hematological issue appears to have been resolved, she can return to clinic in 6 months for an additional lab.  If this shows complete correction, then she can be discharged to her primary care physician for future follow-up.  Great toe pain, left She had a mechanical fall and fell on her foot, since then she has noticed some swelling of the left great and she apparently had a x-ray in [place] system which showed a fracture.  She has splinted her great toe.  She was hoping that she can see an orthopedic doctor for additional recommendations.  Recommended trying Ortho urgent care if she needs to be seen immediately, she will most likely need showed to help nonweightbearing.  She will follow-up with orthopedics for additional recommendations.    HISTORY OF PRESENTING ILLNESS:  Amy Joseph 61 y.o. female is here because of thrombocytosis.  This is a very pleasant 61 year old female patient with past medical history significant for diabetes, hypertension referred to hematology for evaluation  of persistent and progressive thrombocytosis.  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    Interval history  Patient is here for follow-up.   Since last visit, she had a mechanical fall and broke her left great toe.  She has also been taking iron diligently twice a day.  She otherwise denies any new B symptoms.  No changes in breathing or bowel habits or urinary habits. No new neurological symptoms.  She has stopped taking her mag citrate since her last visit. Rest of the pertinent 10 point ROS reviewed and negative.  MEDICAL HISTORY:  Past Medical History:  Diagnosis Date   Diabetes mellitus without complication ([place])    Family history of breast cancer 05/16/2021   Hypertension     SURGICAL HISTORY: Past Surgical History:  Procedure Laterality Date   TEE WITHOUT CARDIOVERSION N/A 03/09/2016   Procedure: TRANSESOPHAGEAL ECHOCARDIOGRAM (TEE);  Surgeon: Fay Records, MD;  [facility] END[facility]COPY;  Service: Cardiovascular;  Laterality: N/A;    SOCIAL HISTORY: Social History   Socioeconomic History   Marital status: Single    Spouse name: Not on file   Number of children: Not on file   Years of education: Not on file   Highest education level: Not on file  Occupational History   Not on file  Tobacco Use   Smoking status: Never   Smokeless tobacco: Never  Substance and Sexual Activity   Alcohol use: Yes   Drug use: No   Sexual activity: Not Currently  Other Topics Concern   Not on file  Social History Narrative   Not on file  Social Determinants of Health   Financial Resource Strain: Not on file  Food Insecurity: Not on file  Transportation Needs: Not on file  Physical Activity: Not on file  Stress: Not on file  Social Connections: Not on file  Intimate Partner Violence: Not on file    FAMILY HISTORY: Family History  Problem Relation Age of Onset   Breast cancer Mother        dx 23s; d. 14   Cancer  Maternal Grandmother        unknown type; ? ovarian; d. 38-50s    ALLERGIES:  is allergic to sulfa antibiotics, bee venom, codeine, penicillins, and percocet [oxycodone-acetaminophen].  MEDICATIONS:  Current Outpatient Medications  Medication Sig Dispense Refill   albuterol (PROVENTIL HFA;VENTOLIN HFA) 108 (90 Base) MCG/ACT inhaler Inhale 1-2 puffs into the lungs every 6 (six) hours as needed for wheezing or shortness of breath.     atorvastatin (LIPITOR) 20 MG tablet Take 1 tablet (20 mg total) by mouth daily at 6 PM. 30 tablet 0   BIOTIN PO Take by mouth.     bisoprolol-hydrochlorothiazide (ZIAC) 5-6.25 MG tablet Take 1 tablet by mouth daily.     Calcium Citrate-Vitamin D (CALCIUM + D PO) Take by mouth.     Dulaglutide 1.5 MG/0.5ML SOPN Inject into the skin once a week.     empagliflozin (JARDIANCE) 10 MG TABS tablet Take 10 mg by mouth daily.     ergocalciferol (VITAMIN D2) 1.25 MG (50000 UT) capsule Take by mouth.     escitalopram (LEXAPRO) 20 MG tablet      haloperidol (HALDOL) 0.5 MG tablet Take 0.5 mg by mouth at bedtime.     hydrOXYzine (ATARAX/VISTARIL) 25 MG tablet      Iron-Vit C-Vit B12-Folic Acid (IRON 235 PLUS PO)      loratadine (CLARITIN) 10 MG tablet Take by mouth.     losartan (COZAAR) 100 MG tablet Take 100 mg by mouth daily.     metFORMIN (GLUCOPHAGE) 1000 MG tablet Take 1,000 mg by mouth 2 (two) times daily with a meal.     methocarbamol (ROBAXIN) 500 MG tablet      montelukast (SINGULAIR) 10 MG tablet Take 10 mg by mouth at bedtime.     sertraline (ZOLOFT) 50 MG tablet Take 50 mg by mouth daily.     vitamin B-12 (CYANOCOBALAMIN) 1000 MCG tablet Take 1,000 mcg by mouth daily.     Vitamins A & D (VITAMIN A & D PO) Take 1 tablet by mouth daily.     No current facility-administered medications for this visit.    PHYSICAL EXAMINATION: ECOG PERFORMANCE STATUS: 0 - Asymptomatic  Vitals:   08/28/21 0919  BP: 118/79  Pulse: 80  Resp: 16  Temp: (!) 97.3 F (36.3  C)  SpO2: 95%    Filed Weights   08/28/21 0919  Weight: 245 lb 4.8 oz (111.3 kg)   Physical Exam Constitutional:      Appearance: Normal appearance.  HENT:     Head: Normocephalic and atraumatic.  Cardiovascular:     Rate and Rhythm: Normal rate and regular rhythm.     Pulses: Normal pulses.     Heart sounds: Normal heart sounds.  Pulmonary:     Effort: Pulmonary effort is normal.     Breath sounds: Normal breath sounds.  Abdominal:     General: Abdomen is flat. Bowel sounds are normal.  Musculoskeletal:        General: Swelling (left great toe) present.  Cervical back: Normal range of motion and neck supple. No rigidity.  Lymphadenopathy:     Cervical: No cervical adenopathy.  Skin:    General: Skin is warm and dry.  Neurological:     Mental Status: She is alert.   LABORATORY DATA:  I have reviewed the data as listed Lab Results  Component Value Date   WBC 5.3 08/28/2021   HGB 12.4 08/28/2021   HCT 41.8 08/28/2021   MCV 82.4 08/28/2021   PLT 378 08/28/2021     Chemistry      Component Value Date/Time   NA 141 08/28/2021 0905   K 4.0 08/28/2021 0905   CL 108 08/28/2021 0905   CO2 26 08/28/2021 0905   BUN 13 08/28/2021 0905   CREATININE 1.08 (H) 08/28/2021 0905      Component Value Date/Time   CALCIUM 9.3 08/28/2021 0905   ALKPHOS 91 08/28/2021 0905   AST 22 08/28/2021 0905   ALT 26 08/28/2021 0905   BILITOT 0.6 08/28/2021 0905      Labs from March 21, 2020 showed a white count of 6.17, hemoglobin of 11.2, platelet count of 4 24,000. We do not have any recent labs from PCP's office, request submitted. CBC from February 11, 2019 showed a white count of 5.33, hemoglobin of 12.3, hematocrit of 40.9 and platelet count of 350,000 Most recent labs from April 10, 2021 showed a platelet count of 500,000, no leukocytosis, hemoglobin of 11 g/dL.  No basophilia noted.  I have reviewed these most recent labs on her cell phone. CBC from 05/16/2019 , microcytic  hypochromic anemia, thrombocytosis and iron deficiency.  Labs from today showed CBC, white blood cell count of 5300, hemoglobin of 12.4 and platelet count of 378,000. Comprehensive metabolic panel showed a creatinine of 1.08, no other concerns  RADIOGRAPHIC STUDIES: I have personally reviewed the radiological images as listed and agreed with the findings in the report. No results found.  All questions were answered. The patient knows to call the clinic with any problems, questions or concerns. I spent 30 minutes in the care of this patient including H and P, review of records, counseling and coordination of care, documentation     Benay Pike, MD 08/28/2021 10:43 AM

## 2021-08-28 ENCOUNTER — Inpatient Hospital Stay: Payer: 59

## 2021-08-28 ENCOUNTER — Inpatient Hospital Stay: Payer: 59 | Attending: Hematology and Oncology | Admitting: Hematology and Oncology

## 2021-08-28 ENCOUNTER — Other Ambulatory Visit: Payer: Self-pay

## 2021-08-28 ENCOUNTER — Encounter: Payer: Self-pay | Admitting: Hematology and Oncology

## 2021-08-28 VITALS — BP 118/79 | HR 80 | Temp 97.3°F | Resp 16 | Ht 63.0 in | Wt 245.3 lb

## 2021-08-28 DIAGNOSIS — Z7984 Long term (current) use of oral hypoglycemic drugs: Secondary | ICD-10-CM | POA: Diagnosis not present

## 2021-08-28 DIAGNOSIS — I10 Essential (primary) hypertension: Secondary | ICD-10-CM | POA: Diagnosis not present

## 2021-08-28 DIAGNOSIS — Z7985 Long-term (current) use of injectable non-insulin antidiabetic drugs: Secondary | ICD-10-CM | POA: Insufficient documentation

## 2021-08-28 DIAGNOSIS — Z79899 Other long term (current) drug therapy: Secondary | ICD-10-CM | POA: Insufficient documentation

## 2021-08-28 DIAGNOSIS — M79675 Pain in left toe(s): Secondary | ICD-10-CM | POA: Diagnosis not present

## 2021-08-28 DIAGNOSIS — D75839 Thrombocytosis, unspecified: Secondary | ICD-10-CM | POA: Insufficient documentation

## 2021-08-28 DIAGNOSIS — D509 Iron deficiency anemia, unspecified: Secondary | ICD-10-CM

## 2021-08-28 DIAGNOSIS — E119 Type 2 diabetes mellitus without complications: Secondary | ICD-10-CM | POA: Insufficient documentation

## 2021-08-28 LAB — COMPREHENSIVE METABOLIC PANEL
ALT: 26 U/L (ref 0–44)
AST: 22 U/L (ref 15–41)
Albumin: 3.8 g/dL (ref 3.5–5.0)
Alkaline Phosphatase: 91 U/L (ref 38–126)
Anion gap: 7 (ref 5–15)
BUN: 13 mg/dL (ref 8–23)
CO2: 26 mmol/L (ref 22–32)
Calcium: 9.3 mg/dL (ref 8.9–10.3)
Chloride: 108 mmol/L (ref 98–111)
Creatinine, Ser: 1.08 mg/dL — ABNORMAL HIGH (ref 0.44–1.00)
GFR, Estimated: 58 mL/min — ABNORMAL LOW (ref >60)
Glucose, Bld: 130 mg/dL — ABNORMAL HIGH (ref 70–99)
Potassium: 4 mmol/L (ref 3.5–5.1)
Sodium: 141 mmol/L (ref 135–145)
Total Bilirubin: 0.6 mg/dL (ref 0.3–1.2)
Total Protein: 7.1 g/dL (ref 6.5–8.1)

## 2021-08-28 LAB — CBC WITH DIFFERENTIAL/PLATELET
Abs Immature Granulocytes: 0 10*3/uL (ref 0.00–0.07)
Basophils Absolute: 0.1 10*3/uL (ref 0.0–0.1)
Basophils Relative: 1 %
Eosinophils Absolute: 0.4 10*3/uL (ref 0.0–0.5)
Eosinophils Relative: 7 %
HCT: 41.8 % (ref 36.0–46.0)
Hemoglobin: 12.4 g/dL (ref 12.0–15.0)
Immature Granulocytes: 0 %
Lymphocytes Relative: 35 %
Lymphs Abs: 1.9 10*3/uL (ref 0.7–4.0)
MCH: 24.5 pg — ABNORMAL LOW (ref 26.0–34.0)
MCHC: 29.7 g/dL — ABNORMAL LOW (ref 30.0–36.0)
MCV: 82.4 fL (ref 80.0–100.0)
Monocytes Absolute: 0.4 10*3/uL (ref 0.1–1.0)
Monocytes Relative: 8 %
Neutro Abs: 2.6 10*3/uL (ref 1.7–7.7)
Neutrophils Relative %: 49 %
Platelets: 378 10*3/uL (ref 150–400)
RBC: 5.07 MIL/uL (ref 3.87–5.11)
RDW: 18.3 % — ABNORMAL HIGH (ref 11.5–15.5)
WBC: 5.3 10*3/uL (ref 4.0–10.5)
nRBC: 0 % (ref 0.0–0.2)

## 2021-08-28 LAB — IRON AND IRON BINDING CAPACITY (CC-WL,HP ONLY)
Iron: 79 ug/dL (ref 28–170)
Saturation Ratios: 19 % (ref 10.4–31.8)
TIBC: 424 ug/dL (ref 250–450)
UIBC: 345 ug/dL (ref 148–442)

## 2021-08-28 LAB — FERRITIN: Ferritin: 16 ng/mL (ref 11–307)

## 2021-08-28 NOTE — Assessment & Plan Note (Signed)
This is a very pleasant 61 year old female patient with past medical history significant for diabetes, hypertension referred to hematology for evaluation of persistent and progressive thrombocytosis.  Since last visit patient has been taking her iron diligently.  Today she had no concerns except for a broken left great toe after mechanical fall.  Physical examination unremarkable, no palpable lymphadenopathy or hepatosplenomegaly. I have reviewed his CBC which showed correction of thrombocytosis.  Iron panel and ferritin are pending.  At this time since her hematological issue appears to have been resolved, she can return to clinic in 6 months for an additional lab.  If this shows complete correction, then she can be discharged to her primary care physician for future follow-up.

## 2021-08-28 NOTE — Assessment & Plan Note (Signed)
She had a mechanical fall and fell on her foot, since then she has noticed some swelling of the left great and she apparently had a x-ray in [place] system which showed a fracture.  She has splinted her great toe.  She was hoping that she can see an orthopedic doctor for additional recommendations.  Recommended trying Ortho urgent care if she needs to be seen immediately, she will most likely need showed to help nonweightbearing.  She will follow-up with orthopedics for additional recommendations.

## 2021-10-23 ENCOUNTER — Telehealth: Payer: Self-pay | Admitting: Hematology and Oncology

## 2021-10-23 NOTE — Telephone Encounter (Signed)
Rescheduled appointment per providers. Patient aware.  ? ?

## 2022-02-25 ENCOUNTER — Ambulatory Visit: Payer: 59 | Admitting: Hematology and Oncology

## 2022-02-25 ENCOUNTER — Other Ambulatory Visit: Payer: 59

## 2022-02-27 ENCOUNTER — Ambulatory Visit: Payer: 59 | Admitting: Hematology and Oncology

## 2022-02-27 ENCOUNTER — Other Ambulatory Visit: Payer: 59

## 2022-03-19 ENCOUNTER — Encounter: Payer: Self-pay | Admitting: Hematology and Oncology

## 2022-03-19 ENCOUNTER — Inpatient Hospital Stay: Payer: 59 | Attending: Hematology and Oncology

## 2022-03-19 ENCOUNTER — Other Ambulatory Visit: Payer: Self-pay

## 2022-03-19 ENCOUNTER — Inpatient Hospital Stay (HOSPITAL_BASED_OUTPATIENT_CLINIC_OR_DEPARTMENT_OTHER): Payer: 59 | Admitting: Hematology and Oncology

## 2022-03-19 VITALS — BP 116/77 | HR 86 | Temp 98.0°F | Resp 18 | Ht 63.0 in | Wt 252.1 lb

## 2022-03-19 DIAGNOSIS — Z803 Family history of malignant neoplasm of breast: Secondary | ICD-10-CM | POA: Insufficient documentation

## 2022-03-19 DIAGNOSIS — Z8673 Personal history of transient ischemic attack (TIA), and cerebral infarction without residual deficits: Secondary | ICD-10-CM | POA: Insufficient documentation

## 2022-03-19 DIAGNOSIS — D75839 Thrombocytosis, unspecified: Secondary | ICD-10-CM | POA: Insufficient documentation

## 2022-03-19 DIAGNOSIS — D509 Iron deficiency anemia, unspecified: Secondary | ICD-10-CM

## 2022-03-19 LAB — COMPREHENSIVE METABOLIC PANEL
ALT: 39 U/L (ref 0–44)
AST: 28 U/L (ref 15–41)
Albumin: 3.9 g/dL (ref 3.5–5.0)
Alkaline Phosphatase: 89 U/L (ref 38–126)
Anion gap: 7 (ref 5–15)
BUN: 17 mg/dL (ref 8–23)
CO2: 29 mmol/L (ref 22–32)
Calcium: 9.5 mg/dL (ref 8.9–10.3)
Chloride: 104 mmol/L (ref 98–111)
Creatinine, Ser: 1.06 mg/dL — ABNORMAL HIGH (ref 0.44–1.00)
GFR, Estimated: 59 mL/min — ABNORMAL LOW (ref >60)
Glucose, Bld: 97 mg/dL (ref 70–99)
Potassium: 4.2 mmol/L (ref 3.5–5.1)
Sodium: 140 mmol/L (ref 135–145)
Total Bilirubin: 0.6 mg/dL (ref 0.3–1.2)
Total Protein: 6.9 g/dL (ref 6.5–8.1)

## 2022-03-19 LAB — CBC WITH DIFFERENTIAL/PLATELET
Abs Immature Granulocytes: 0.01 10*3/uL (ref 0.00–0.07)
Basophils Absolute: 0 10*3/uL (ref 0.0–0.1)
Basophils Relative: 1 %
Eosinophils Absolute: 0.2 10*3/uL (ref 0.0–0.5)
Eosinophils Relative: 3 %
HCT: 42.4 % (ref 36.0–46.0)
Hemoglobin: 13.5 g/dL (ref 12.0–15.0)
Immature Granulocytes: 0 %
Lymphocytes Relative: 39 %
Lymphs Abs: 2.2 10*3/uL (ref 0.7–4.0)
MCH: 28 pg (ref 26.0–34.0)
MCHC: 31.8 g/dL (ref 30.0–36.0)
MCV: 88 fL (ref 80.0–100.0)
Monocytes Absolute: 0.4 10*3/uL (ref 0.1–1.0)
Monocytes Relative: 7 %
Neutro Abs: 2.7 10*3/uL (ref 1.7–7.7)
Neutrophils Relative %: 50 %
Platelets: 319 10*3/uL (ref 150–400)
RBC: 4.82 MIL/uL (ref 3.87–5.11)
RDW: 15.5 % (ref 11.5–15.5)
WBC: 5.5 10*3/uL (ref 4.0–10.5)
nRBC: 0 % (ref 0.0–0.2)

## 2022-03-19 NOTE — Progress Notes (Signed)
[place] CONSULT NOTE  Patient Care Team: Dulce Sellar, MD as PCP - General (General Practice)  CHIEF COMPLAINTS/PURPOSE OF CONSULTATION:   Thrombocytosis  ASSESSMENT & PLAN:   This is a very pleasant 61 year old female patient referred to hematology for evaluation of persistent and progressive thrombocytosis.   She is now on oral iron, ferrous fumarate 85 mg po daily. No new health complaints PE unremarkable, no lymphadenopathy or hepatosplenomegaly CBC from today showed resolution of thrombocytosis Her thrombocytosis is likely reactive from mild iron deficiency I recommended to continue oral iron supplementation for another 3 months and discontinue. She can continue annual CBC with PCP at her annual visit. RTC with hematology as needed.  No problem-specific Assessment & Plan notes found for this encounter.    HISTORY OF PRESENTING ILLNESS:  Amy Joseph 61 y.o. female is here because of thrombocytosis.  This is a very pleasant 61 year old female patient with past medical history significant for diabetes, hypertension referred to hematology for evaluation of persistent and progressive thrombocytosis.  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    Interval history  Patient is here for follow-up.   She is tolerating oral iron very well. No complaints at all. No interim hospitalizations or infections  Rest of the pertinent 10 point ROS reviewed and negative.  MEDICAL HISTORY:  Past Medical History:  Diagnosis Date   Diabetes mellitus without complication ([place])    Family history of breast cancer 05/16/2021   Hypertension     SURGICAL HISTORY: Past Surgical History:  Procedure Laterality Date   TEE WITHOUT CARDIOVERSION N/A 03/09/2016   Procedure: TRANSESOPHAGEAL ECHOCARDIOGRAM (TEE);  Surgeon: Fay Records, MD;  Location: [facility] ENDOSCOPY;  Service: Cardiovascular;  Laterality: N/A;     SOCIAL HISTORY: Social History   Socioeconomic History   Marital status: Single    Spouse name: Not on file   Number of children: Not on file   Years of education: Not on file   Highest education level: Not on file  Occupational History   Not on file  Tobacco Use   Smoking status: Never   Smokeless tobacco: Never  Substance and Sexual Activity   Alcohol use: Yes   Drug use: No   Sexual activity: Not Currently  Other Topics Concern   Not on file  Social History Narrative   Not on file   Social Determinants of Health   Financial Resource Strain: Not on file  Food Insecurity: Not on file  Transportation Needs: Not on file  Physical Activity: Not on file  Stress: Not on file  Social Connections: Not on file  Intimate Partner Violence: Not on file    FAMILY HISTORY: Family History  Problem Relation Age of Onset   Breast cancer Mother        dx 39s; d. 71   Cancer Maternal Grandmother        unknown type; ? ovarian; d. 58-50s    ALLERGIES:  is allergic to sulfa antibiotics, bee venom, codeine, penicillins, and percocet [oxycodone-acetaminophen].  MEDICATIONS:  Current Outpatient Medications  Medication Sig Dispense Refill   albuterol (PROVENTIL HFA;VENTOLIN HFA) 108 (90 Base) MCG/ACT inhaler Inhale 1-2 puffs into the lungs every 6 (six) hours as needed for wheezing or shortness of breath.     atorvastatin (LIPITOR) 20 MG tablet Take 1 tablet (20 mg total) by mouth daily at 6 PM. 30 tablet 0   BIOTIN  PO Take by mouth.     bisoprolol-hydrochlorothiazide (ZIAC) 5-6.25 MG tablet Take 1 tablet by mouth daily.     Calcium Citrate-Vitamin D (CALCIUM + D PO) Take by mouth.     Dulaglutide 1.5 MG/0.5ML SOPN Inject into the skin once a week.     empagliflozin (JARDIANCE) 10 MG TABS tablet Take 10 mg by mouth daily.     ergocalciferol (VITAMIN D2) 1.25 MG (50000 UT) capsule Take by mouth.     escitalopram (LEXAPRO) 20 MG tablet      haloperidol (HALDOL) 0.5 MG tablet  Take 0.5 mg by mouth at bedtime.     hydrOXYzine (ATARAX/VISTARIL) 25 MG tablet      Iron-Vit C-Vit B12-Folic Acid (IRON 299 PLUS PO)      loratadine (CLARITIN) 10 MG tablet Take by mouth.     losartan (COZAAR) 100 MG tablet Take 100 mg by mouth daily.     metFORMIN (GLUCOPHAGE) 1000 MG tablet Take 1,000 mg by mouth 2 (two) times daily with a meal.     methocarbamol (ROBAXIN) 500 MG tablet      montelukast (SINGULAIR) 10 MG tablet Take 10 mg by mouth at bedtime.     sertraline (ZOLOFT) 50 MG tablet Take 50 mg by mouth daily.     vitamin B-12 (CYANOCOBALAMIN) 1000 MCG tablet Take 1,000 mcg by mouth daily.     Vitamins A & D (VITAMIN A & D PO) Take 1 tablet by mouth daily.     No current facility-administered medications for this visit.    PHYSICAL EXAMINATION: ECOG PERFORMANCE STATUS: 0 - Asymptomatic  Vitals:   03/19/22 1522  BP: 116/77  Pulse: 86  Resp: 18  Temp: 98 F (36.7 C)  SpO2: 98%    Filed Weights   03/19/22 1522  Weight: 252 lb 1.6 oz (114.4 kg)   Physical Exam Constitutional:      Appearance: Normal appearance.  HENT:     Head: Normocephalic and atraumatic.  Cardiovascular:     Rate and Rhythm: Normal rate and regular rhythm.     Pulses: Normal pulses.     Heart sounds: Normal heart sounds.  Pulmonary:     Effort: Pulmonary effort is normal.     Breath sounds: Normal breath sounds.  Abdominal:     General: Abdomen is flat. Bowel sounds are normal.  Musculoskeletal:        General: No swelling.     Cervical back: Normal range of motion and neck supple. No rigidity.  Lymphadenopathy:     Cervical: No cervical adenopathy.  Skin:    General: Skin is warm and dry.  Neurological:     Mental Status: She is alert.    LABORATORY DATA:  I have reviewed the data as listed Lab Results  Component Value Date   WBC 5.5 03/19/2022   HGB 13.5 03/19/2022   HCT 42.4 03/19/2022   MCV 88.0 03/19/2022   PLT 319 03/19/2022     Chemistry      Component Value  Date/Time   NA 141 08/28/2021 0905   K 4.0 08/28/2021 0905   CL 108 08/28/2021 0905   CO2 26 08/28/2021 0905   BUN 13 08/28/2021 0905   CREATININE 1.08 (H) 08/28/2021 0905      Component Value Date/Time   CALCIUM 9.3 08/28/2021 0905   ALKPHOS 91 08/28/2021 0905   AST 22 08/28/2021 0905   ALT 26 08/28/2021 0905   BILITOT 0.6 08/28/2021 0905  Labs from March 21, 2020 showed a white count of 6.17, hemoglobin of 11.2, platelet count of 4 24,000. We do not have any recent labs from PCP's office, request submitted. CBC from February 11, 2019 showed a white count of 5.33, hemoglobin of 12.3, hematocrit of 40.9 and platelet count of 350,000 Most recent labs from April 10, 2021 showed a platelet count of 500,000, no leukocytosis, hemoglobin of 11 g/dL.  No basophilia noted.  I have reviewed these most recent labs on her cell phone. CBC from 05/16/2019 , microcytic hypochromic anemia, thrombocytosis and iron deficiency.  CBC from today reviewed, no thrombocytosis.  RADIOGRAPHIC STUDIES: I have personally reviewed the radiological images as listed and agreed with the findings in the report. No results found.  All questions were answered. The patient knows to call the clinic with any problems, questions or concerns. I spent 20 minutes in the care of this patient including H and P, review of records, counseling and coordination of care, documentation     Amy Pike, MD 03/19/2022 3:36 PM

## 2022-05-01 IMAGING — MG MM DIGITAL SCREENING BILAT W/ TOMO AND CAD
8 of 17 series · 8 of 40 positions shown · non-contrast
Comparison: Previous exam(s).

CLINICAL DATA: Screening.

EXAM:
DIGITAL SCREENING BILATERAL MAMMOGRAM WITH TOMOSYNTHESIS AND CAD
TECHNIQUE: Bilateral screening digital craniocaudal and mediolateral oblique
mammograms were obtained. Bilateral screening digital breast
tomosynthesis was performed. The images were evaluated with
computer-aided detection.

[R MLO synth-2D (1 of 2)]
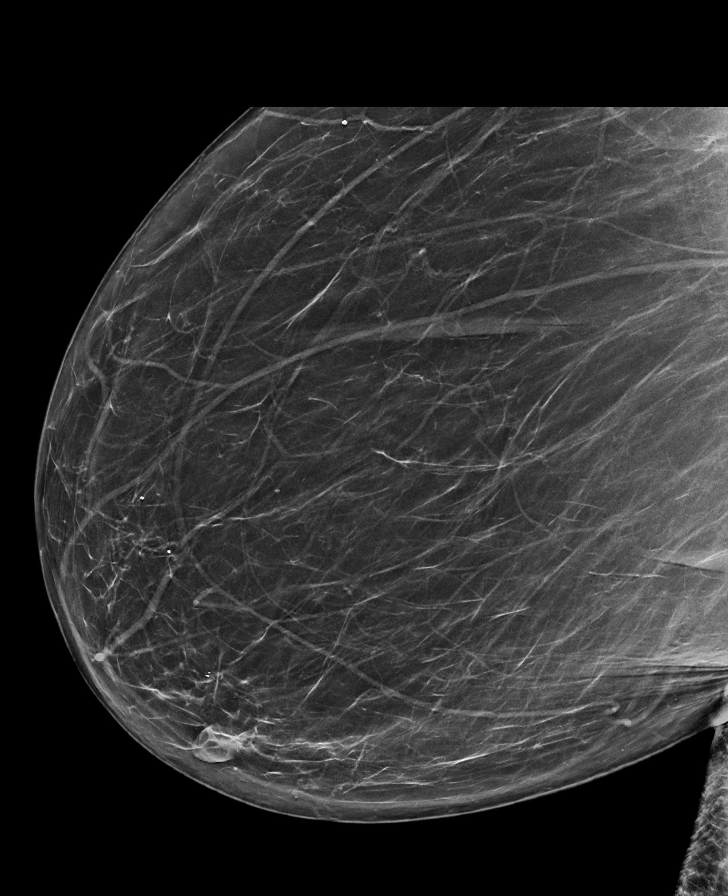

[R CC synth-2D]
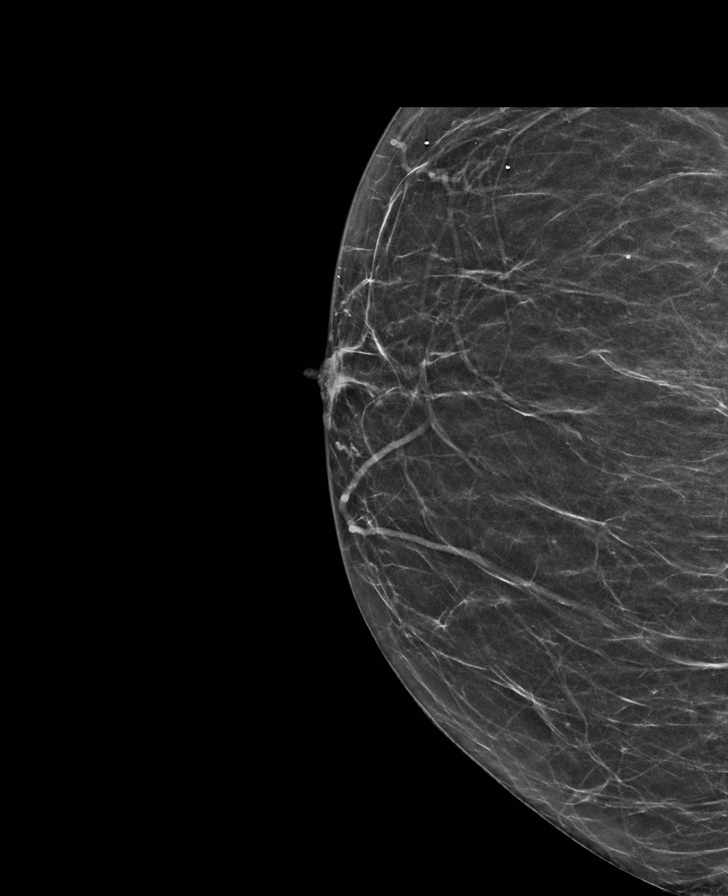

[L MLO synth-2D (1 of 2)]
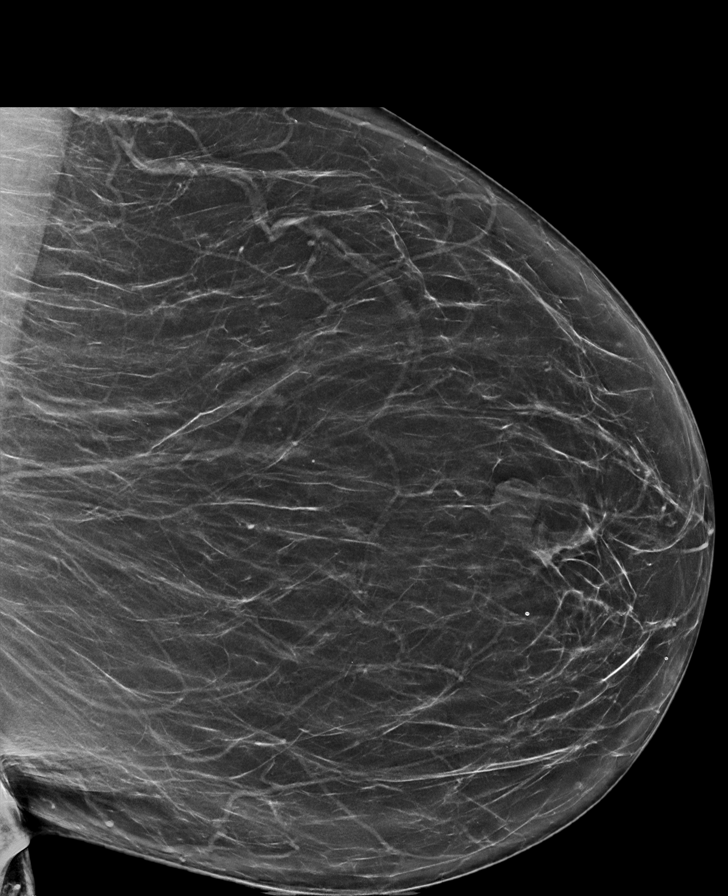

[L CC synth-2D]
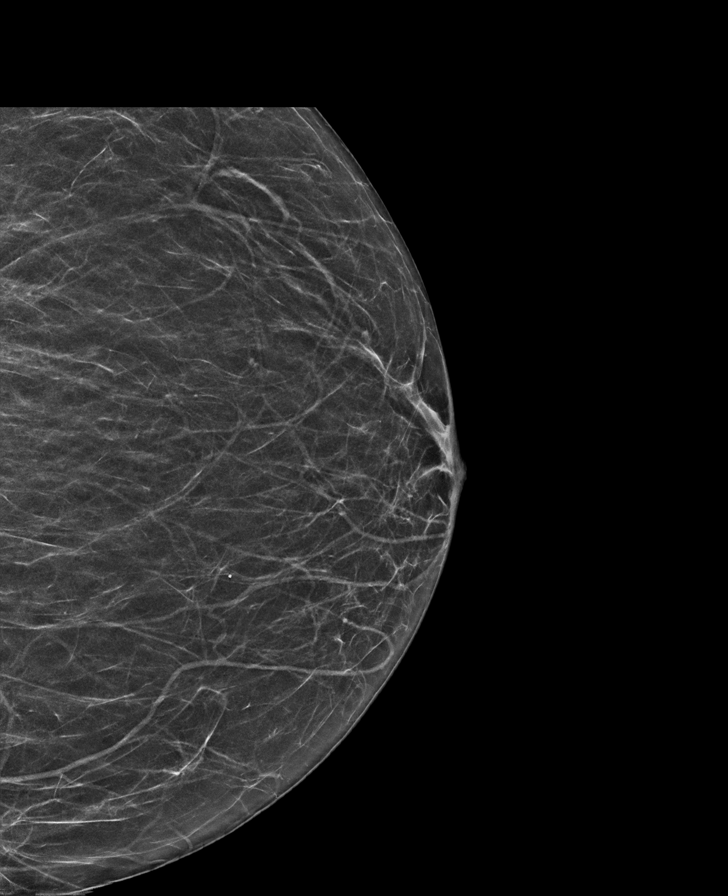

[R MLO synth-2D (2 of 2)]
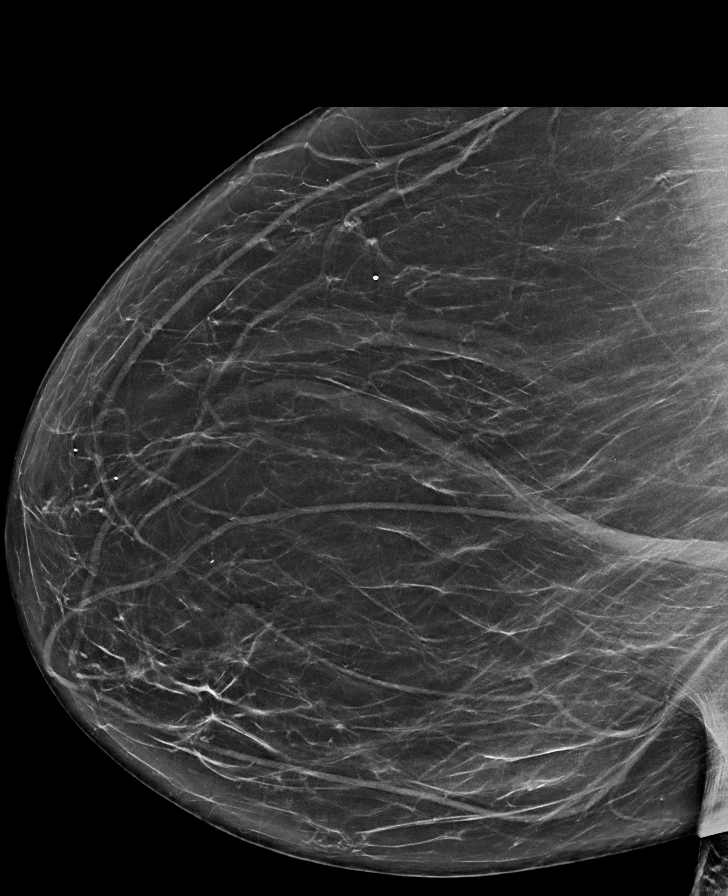

[L XCCL synth-2D]
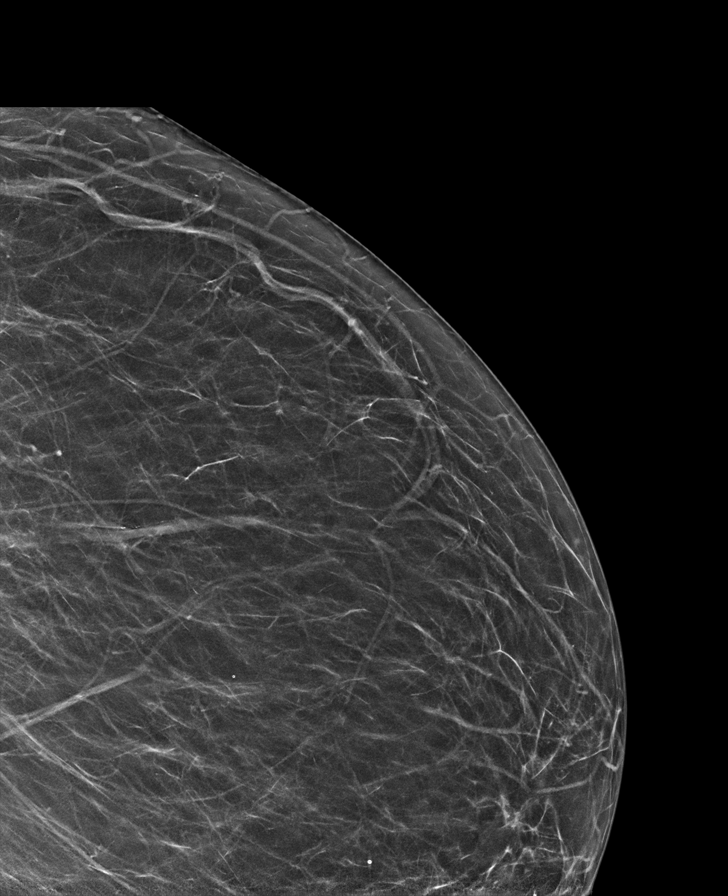

[L MLO synth-2D (2 of 2)]
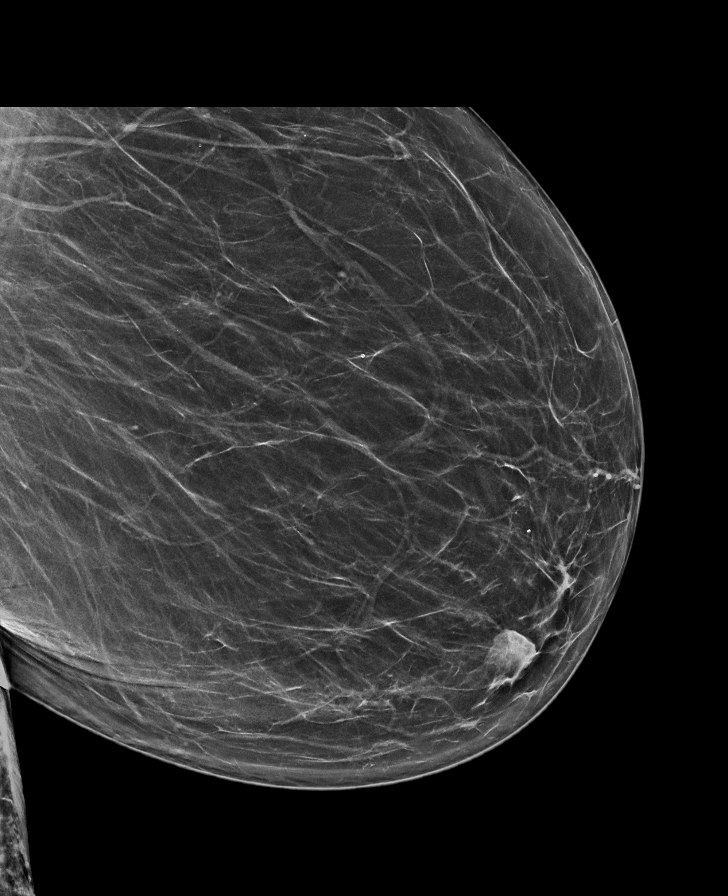

[R CC tomo · tomo slice 27/54.0]
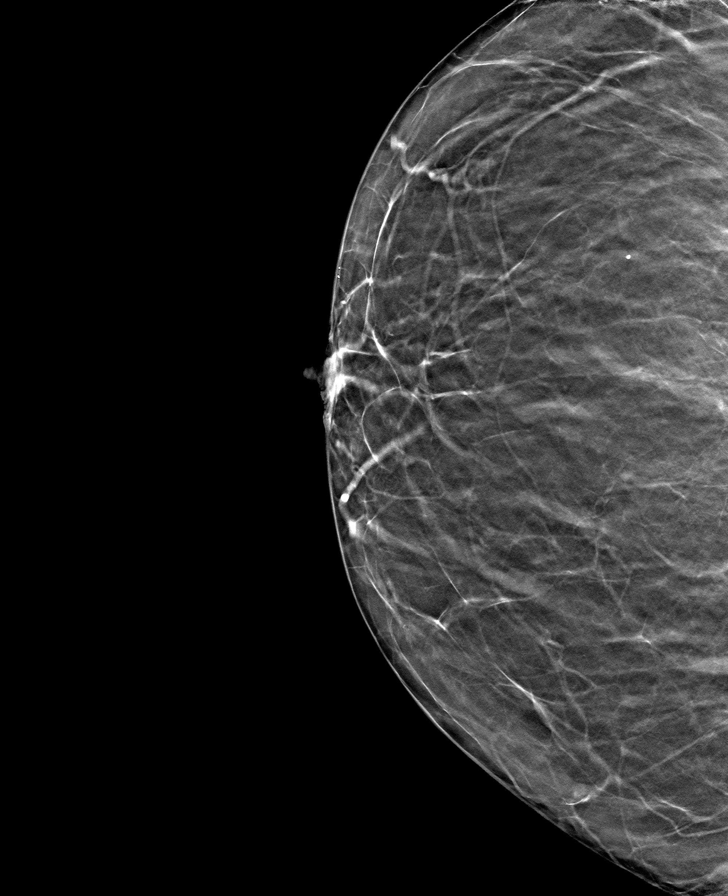

[8 of 40 positions shown; findings below may reference images not displayed]

ACR Breast Density Category b: There are scattered areas of
fibroglandular density.
FINDINGS: There are no findings suspicious for malignancy.
IMPRESSION: No mammographic evidence of malignancy. A result letter of this
screening mammogram will be mailed directly to the patient.

RECOMMENDATION:
Screening mammogram in one year. (Code:51-O-LD2)

BI-RADS CATEGORY  1: Negative.

## 2022-05-11 ENCOUNTER — Other Ambulatory Visit: Payer: Self-pay | Admitting: Adult Medicine

## 2022-05-11 DIAGNOSIS — Z1231 Encounter for screening mammogram for malignant neoplasm of breast: Secondary | ICD-10-CM

## 2022-05-14 ENCOUNTER — Other Ambulatory Visit (HOSPITAL_COMMUNITY)
Admission: RE | Admit: 2022-05-14 | Discharge: 2022-05-14 | Disposition: A | Discharge status: Home / Self Care | Payer: 59 | Source: Ambulatory Visit | Attending: Nurse Practitioner | Admitting: Nurse Practitioner

## 2022-05-14 ENCOUNTER — Encounter: Payer: Self-pay | Admitting: Nurse Practitioner

## 2022-05-14 ENCOUNTER — Ambulatory Visit (INDEPENDENT_AMBULATORY_CARE_PROVIDER_SITE_OTHER): Payer: 59 | Admitting: Nurse Practitioner

## 2022-05-14 VITALS — BP 134/82 | HR 78 | Ht 64.75 in | Wt 250.0 lb

## 2022-05-14 DIAGNOSIS — Z780 Asymptomatic menopausal state: Secondary | ICD-10-CM

## 2022-05-14 DIAGNOSIS — Z01419 Encounter for gynecological examination (general) (routine) without abnormal findings: Secondary | ICD-10-CM | POA: Diagnosis not present

## 2022-05-14 NOTE — Progress Notes (Signed)
   Amy Joseph 03-10-1961 [id number]   History:  61 y.o. [id number] presents as new patient to establish care. No GYN complaints. Postmenopausal - no HRT, no bleeding. Cryosurgery in her 98s, normal paps since. HTN, T2DM, vitamin D deficiency managed by PCP. 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   Gynecologic History No LMP recorded. Patient is postmenopausal.   Contraception/Family planning: post menopausal status Sexually active: No  Health Maintenance Last Pap: 2015 per records. Results were: Normal Last mammogram: 06/11/2021. Results were: Normal Last colonoscopy: 2018. Negative Cologuard 04/11/2019 Last Dexa: 04/29/2021. Results were: Normal  Past medical history, past surgical history, family history and social history were all reviewed and documented in the EPIC chart. Single. Recently moved back here from [place] after stroke. Son in [place]. Living with daughter and 2 yo grandson.  ROS:  A ROS was performed and pertinent positives and negatives are included.  Exam:  Vitals:   05/14/22 1346  BP: 134/82  Pulse: 78  Weight: 250 lb (113.4 kg)  Height: 5' 4.75" (1.645 m)   Body mass index is 41.92 kg/m.  General appearance:  Normal Thyroid:  Symmetrical, normal in size, without palpable masses or nodularity. Respiratory  Auscultation:  Clear without wheezing or rhonchi Cardiovascular  Auscultation:  Regular rate, without rubs, murmurs or gallops  Edema/varicosities:  Not grossly evident Abdominal  Soft,nontender, without masses, guarding or rebound.  Liver/spleen:  No organomegaly noted  Hernia:  None appreciated  Skin  Inspection:  Grossly normal Breasts: Examined lying and sitting.   Right: Without masses, retractions, nipple discharge or axillary adenopathy.   Left: Without masses, retractions, nipple discharge or axillary adenopathy. Genitourinary   Inguinal/mons:  Normal without inguinal adenopathy  External genitalia:  Normal appearing vulva with no  masses, tenderness, or lesions  BUS/Urethra/Skene's glands:  Normal  Vagina:  Normal appearing with normal color and discharge, no lesions  Cervix:  Normal appearing without discharge or lesions  Uterus:  Difficult to palpate due to body habitus but no gross masses or tenderness  Adnexa/parametria:     Rt: Normal in size, without masses or tenderness.   Lt: Normal in size, without masses or tenderness.  Anus and perineum: Normal  Digital rectal exam: Normal sphincter tone without palpated masses or tenderness  Patient informed chaperone available to be present for breast and pelvic exam. Patient has requested no chaperone to be present. Patient has been advised what will be completed during breast and pelvic exam.   Assessment/Plan:  61 y.o. G3P2012 to establish care.   Well female exam with routine gynecological exam - Plan: Cytology - PAP( [facility]). Education provided on SBEs, importance of preventative screenings, current guidelines, high calcium diet, regular exercise, and multivitamin daily. Labs with PCP.   Postmenopausal - no HRT, no bleeding.   Screening for cervical cancer - Cryosurgery in her 62s, normal paps since. Pap today.   Screening for breast cancer - Normal mammogram history.  Continue annual screenings.  Normal breast exam today.  Screening for colon cancer - 2018 colonoscopy, neg 2020 cologuard. Will repeat at GI's recommended interval.   Screening for osteoporosis - Normal bone density last year per patient.  Taking Vitamin D + Calcium.   Return in 1 year for annual.     Tamela Gammon DNP, 2:17 PM 05/14/2022

## 2022-05-14 NOTE — Patient Instructions (Signed)
Schedule Colonoscopy! ?[facility] ?[phone] ?[address] [place], [place] [address] ? ?

## 2022-05-19 LAB — CYTOLOGY - PAP
Comment: NEGATIVE
Diagnosis: NEGATIVE
High risk HPV: NEGATIVE

## 2022-06-12 ENCOUNTER — Ambulatory Visit: Payer: 59

## 2022-07-24 ENCOUNTER — Ambulatory Visit
Admission: RE | Admit: 2022-07-24 | Discharge: 2022-07-24 | Disposition: A | Discharge status: Home / Self Care | Payer: 59 | Source: Ambulatory Visit | Attending: Adult Medicine | Admitting: Adult Medicine

## 2022-07-24 DIAGNOSIS — Z1231 Encounter for screening mammogram for malignant neoplasm of breast: Secondary | ICD-10-CM

## 2023-06-16 ENCOUNTER — Ambulatory Visit (INDEPENDENT_AMBULATORY_CARE_PROVIDER_SITE_OTHER): Payer: Medicare HMO | Admitting: Podiatry

## 2023-06-16 DIAGNOSIS — E119 Type 2 diabetes mellitus without complications: Secondary | ICD-10-CM

## 2023-06-16 NOTE — Progress Notes (Signed)
   Chief Complaint  Patient presents with   Diabetes    DF EXAM    HPI: 62 y.o. female PMHx T2DM without complication presenting today for annual diabetic foot exam.  No complaints.  Patient doing well.  She says that her diabetes is controlled and managed well with her PCP  Past Medical History:  Diagnosis Date   Diabetes mellitus without complication (HCC)    Family history of breast cancer 05/16/2021   Hypertension     Past Surgical History:  Procedure Laterality Date   REPLACEMENT TOTAL KNEE     TEE WITHOUT CARDIOVERSION N/A 03/09/2016   Procedure: TRANSESOPHAGEAL ECHOCARDIOGRAM (TEE);  Surgeon: Pricilla Riffle, MD;  Location: [facility] ENDOSCOPY;  Service: Cardiovascular;  Laterality: N/A;   TUBAL LIGATION      Allergies  Allergen Reactions   Sulfa Antibiotics Shortness Of Breath and Rash   Bee Venom Hives and Swelling   Codeine Itching and Rash   Penicillins Itching and Rash    Has patient had a PCN reaction causing immediate rash, facial/tongue/throat swelling, SOB or lightheadedness with hypotension: Yes Has patient had a PCN reaction causing severe rash involving mucus membranes or skin necrosis: No Has patient had a PCN reaction that required hospitalization No Has patient had a PCN reaction occurring within the last 10 years: No If all of the above answers are "NO", then may proceed with Cephalosporin use.    Percocet [Oxycodone-Acetaminophen] Itching and Rash     Physical Exam: General: The patient is alert and oriented x3 in no acute distress.  Dermatology: Skin is warm, dry and supple bilateral lower extremities.   Vascular: Palpable pedal pulses bilaterally. Capillary refill within normal limits.  No appreciable edema.  No erythema.  Neurological: Grossly intact via light touch  Musculoskeletal Exam: No pedal deformities noted.  Otherwise normal exam   Assessment/Plan of Care: 1.  Diabetes mellitus; uncomplicated 2.  Encounter for diabetic foot  exam  -Patient evaluated.  Comprehensive diabetic foot exam performed today -Continue management with PCP for diabetes control -Advised against going barefoot.  Recommend good supportive shoes and sneakers -Return to clinic annually       Felecia Shelling, DPM [facility]  Dr. Felecia Shelling, DPM    [address] [place], [place] [address]                Office [phone]  Fax [phone]

## 2023-07-12 ENCOUNTER — Other Ambulatory Visit: Payer: Self-pay

## 2023-07-12 DIAGNOSIS — E215 Disorder of parathyroid gland, unspecified: Secondary | ICD-10-CM

## 2023-07-13 ENCOUNTER — Other Ambulatory Visit: Payer: Medicare HMO

## 2023-07-13 ENCOUNTER — Other Ambulatory Visit: Payer: Self-pay

## 2023-07-13 DIAGNOSIS — E215 Disorder of parathyroid gland, unspecified: Secondary | ICD-10-CM

## 2023-07-17 LAB — VITAMIN D 1,25 DIHYDROXY
Vitamin D 1, 25 (OH)2 Total: 43 pg/mL (ref 18–72)
Vitamin D2 1, 25 (OH)2: <8 pg/mL
Vitamin D3 1, 25 (OH)2: 43 pg/mL

## 2023-07-21 ENCOUNTER — Encounter: Payer: Self-pay | Admitting: "Endocrinology

## 2023-07-21 ENCOUNTER — Other Ambulatory Visit: Payer: Self-pay

## 2023-07-21 ENCOUNTER — Ambulatory Visit: Payer: Medicare HMO | Admitting: "Endocrinology

## 2023-07-21 VITALS — BP 130/80 | HR 90 | Ht 64.75 in | Wt 249.0 lb

## 2023-07-21 DIAGNOSIS — E213 Hyperparathyroidism, unspecified: Secondary | ICD-10-CM

## 2023-07-21 DIAGNOSIS — E215 Disorder of parathyroid gland, unspecified: Secondary | ICD-10-CM

## 2023-07-21 NOTE — Progress Notes (Signed)
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   I have reviewed current medications, nurse's notes, allergies, vital signs, past medical and surgical history, family medical history, and social history for this encounter. Counseled patient on symptoms, examination findings, lab findings, imaging results, treatment decisions and monitoring and prognosis. The patient understood the recommendations and agrees with the treatment plan. All questions regarding treatment plan were fully answered.  Amy , Amy Joseph  07/21/23   History of Present Illness HPI   Amy Joseph is a 62 y.o. female referred by Dr. Thompson Grayer for evaluation and management of hypercalcemia.    Takes calcium citrate 500 mg bid and Vit D. 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  Patient has a history of kidney stones x 1 episode B/L kidneys about in 2021. She  current hematuria No polyuria No nocturia No thirst No renal failure No anorexia Yes abdominal pain No heartburn No constipation No nausea or vomiting No history of peptic ulcer  disease No depression Yes confusion No excessive fatigue No fracture No osteoporosis No headaches No numbness No tingling No  She takes Calcium Yes She takes Vitamin D supplements Yes  She a history of taking chronic lithium No She a recent history of thiazide diuretic intake No   She family history of renal stones/hypercalcemia No a personal history of MEN syndromes/medullary thyroid cancer/ pheochromocytoma No  01/2023 Alb 3.9 Ca 9.5 GFR 69 PTH 126.7 (81 in 10/2022)  Physical Exam  BP 130/80   Pulse 90   Ht 5' 4.75" (1.645 m)   Wt 249 lb (112.9 kg)   SpO2 97%   BMI 41.76 kg/m    Constitutional: well developed, well nourished Head: normocephalic, atraumatic Eyes: sclera anicteric, no redness Neck: supple Lungs: normal respiratory effort Neurology: alert and oriented Skin: dry, no appreciable rashes Musculoskeletal: no appreciable defects Psychiatric: normal mood and affect   Current Medications Patient's Medications  New Prescriptions   No medications on file  Previous Medications   ALBUTEROL (PROVENTIL HFA;VENTOLIN HFA) 108 (90 BASE) MCG/ACT INHALER    Inhale 1-2 puffs into the lungs every 6 (six) hours as needed for wheezing or shortness of breath.   ASPIRIN EC 81 MG TABLET    Take 81 mg by mouth daily. Swallow whole.   BIOTIN PO    Take by mouth.   BISOPROLOL-HYDROCHLOROTHIAZIDE (ZIAC) 5-6.25 MG TABLET    Take 1 tablet by mouth daily.   CALCIUM CITRATE-VITAMIN D (CALCIUM + D PO)    Take by mouth.   CONTINUOUS BLOOD GLUC SENSOR (DEXCOM  G7 SENSOR) MISC       DULAGLUTIDE 1.5 MG/0.5ML SOPN    Inject into the skin once a week.   EMPAGLIFLOZIN (JARDIANCE) 10 MG TABS TABLET    Take 10 mg by mouth daily.   ERGOCALCIFEROL (VITAMIN D2) 1.25 MG (50000 UT) CAPSULE    Take by mouth.   ESCITALOPRAM (LEXAPRO) 20 MG TABLET       HALOPERIDOL (HALDOL) 0.5 MG TABLET    Take 0.5 mg by mouth at bedtime.   IRON-VIT C-VIT B12-FOLIC ACID (IRON 100 PLUS PO)       LORATADINE  (CLARITIN) 10 MG TABLET    Take by mouth.   LOSARTAN (COZAAR) 100 MG TABLET    Take 100 mg by mouth daily.   METFORMIN (GLUCOPHAGE) 1000 MG TABLET    Take 1,000 mg by mouth 2 (two) times daily with a meal.   MONTELUKAST (SINGULAIR) 10 MG TABLET    Take 10 mg by mouth at bedtime.   SERTRALINE (ZOLOFT) 50 MG TABLET    Take 50 mg by mouth daily.   VITAMIN B-12 (CYANOCOBALAMIN) 1000 MCG TABLET    Take 1,000 mcg by mouth daily.   VITAMINS A & D (VITAMIN A & D PO)    Take 1 tablet by mouth daily.  Modified Medications   No medications on file  Discontinued Medications   No medications on file    Allergies Allergies  Allergen Reactions   Sulfa Antibiotics Shortness Of Breath and Rash   Bee Venom Hives and Swelling   Codeine Itching and Rash   Penicillins Itching and Rash    Has patient had a PCN reaction causing immediate rash, facial/tongue/throat swelling, SOB or lightheadedness with hypotension: Yes Has patient had a PCN reaction causing severe rash involving mucus membranes or skin necrosis: No Has patient had a PCN reaction that required hospitalization No Has patient had a PCN reaction occurring within the last 10 years: No If all of the above answers are "NO", then may proceed with Cephalosporin use.    Percocet [Oxycodone-Acetaminophen] Itching and Rash    Past Medical History Past Medical History:  Diagnosis Date   Diabetes mellitus without complication (HCC)    Family history of breast cancer 05/16/2021   Hypertension     Past Surgical History Past Surgical History:  Procedure Laterality Date   REPLACEMENT TOTAL KNEE     TEE WITHOUT CARDIOVERSION N/A 03/09/2016   Procedure: TRANSESOPHAGEAL ECHOCARDIOGRAM (TEE);  Surgeon: Pricilla Riffle, Amy Joseph;  Location: [facility] ENDOSCOPY;  Service: Cardiovascular;  Laterality: N/A;   TUBAL LIGATION      Family History family history includes Breast cancer in her mother; Cancer in her maternal grandmother; Diabetes in her brother; Glaucoma in  her father; Hypertension in her father and mother; Kidney disease in her brother.  Social History Social History   Socioeconomic History   Marital status: Single    Spouse name: Not on file   Number of children: Not on file   Years of education: Not on file   Highest education level: Not on file  Occupational History   Not on file  Tobacco Use   Smoking status: Never    Passive exposure: Past   Smokeless tobacco: Never  Vaping Use   Vaping status: Never Used  Substance and Sexual Activity   Alcohol use: Yes    Comment: occ   Drug use: No   Sexual activity: Not Currently    Birth control/protection: Post-menopausal    Comment: 1st intercourse-  17, partners: <10  Other Topics Concern   Not on file  Social History Narrative   Not on file   Social Determinants of Health   Financial Resource Strain: Not on file  Food Insecurity: Not on file  Transportation Needs: Not on file  Physical Activity: Not on file  Stress: Not on file  Social Connections: Not on file  Intimate Partner Violence: Not At Risk (07/01/2022)   Received from [facility], [facility]   Interpersonal Violence    Interpersonal Violence: No    Lab Results  Component Value Date   CHOL 134 03/08/2016   Lab Results  Component Value Date   HDL 43 03/08/2016   Lab Results  Component Value Date   LDLCALC 76 03/08/2016   Lab Results  Component Value Date   TRIG 77 03/08/2016   Lab Results  Component Value Date   CHOLHDL 3.1 03/08/2016   Lab Results  Component Value Date   CREATININE 1.06 (H) 03/19/2022   No results found for: "GFR"    Component Value Date/Time   NA 140 03/19/2022 1511   K 4.2 03/19/2022 1511   CL 104 03/19/2022 1511   CO2 29 03/19/2022 1511   GLUCOSE 97 03/19/2022 1511   BUN 17 03/19/2022 1511   CREATININE 1.06 (H) 03/19/2022 1511   CALCIUM 9.5 03/19/2022 1511   PROT 6.9 03/19/2022 1511   ALBUMIN 3.9 03/19/2022 1511   AST 28 03/19/2022 1511    ALT 39 03/19/2022 1511   ALKPHOS 89 03/19/2022 1511   BILITOT 0.6 03/19/2022 1511   GFRNONAA 59 (L) 03/19/2022 1511   GFRAA >60 03/10/2016 1119      Latest Ref Rng & Units 03/19/2022    3:11 PM 08/28/2021    9:05 AM 05/15/2021   11:11 AM  BMP  Glucose 70 - 99 mg/dL 97  409  811   BUN 8 - 23 mg/dL 17  13  18    Creatinine 0.44 - 1.00 mg/dL 9.14  7.82  9.56   Sodium 135 - 145 mmol/L 140  141  142   Potassium 3.5 - 5.1 mmol/L 4.2  4.0  4.2   Chloride 98 - 111 mmol/L 104  108  106   CO2 22 - 32 mmol/L 29  26  25    Calcium 8.9 - 10.3 mg/dL 9.5  9.3  9.7        Component Value Date/Time   WBC 5.5 03/19/2022 1511   RBC 4.82 03/19/2022 1511   HGB 13.5 03/19/2022 1511   HCT 42.4 03/19/2022 1511   PLT 319 03/19/2022 1511   MCV 88.0 03/19/2022 1511   MCH 28.0 03/19/2022 1511   MCHC 31.8 03/19/2022 1511   RDW 15.5 03/19/2022 1511   LYMPHSABS 2.2 03/19/2022 1511   MONOABS 0.4 03/19/2022 1511   EOSABS 0.2 03/19/2022 1511   BASOSABS 0.0 03/19/2022 1511   No results found for: "TSH", "FREET4"       Parts of this note may have been dictated using voice recognition software. There may be variances in spelling and vocabulary which are unintentional. Not all errors are proofread. Please notify the Thereasa Parkin if any discrepancies are noted or if the meaning of any statement is not clear.

## 2023-07-22 ENCOUNTER — Other Ambulatory Visit: Payer: Self-pay | Admitting: "Endocrinology

## 2023-07-22 ENCOUNTER — Other Ambulatory Visit (INDEPENDENT_AMBULATORY_CARE_PROVIDER_SITE_OTHER): Payer: Medicare HMO | Admitting: "Endocrinology

## 2023-07-22 DIAGNOSIS — E213 Hyperparathyroidism, unspecified: Secondary | ICD-10-CM | POA: Diagnosis not present

## 2023-07-22 LAB — MAGNESIUM: Magnesium: 1.9 mg/dL (ref 1.5–2.5)

## 2023-07-22 LAB — RENAL FUNCTION PANEL
Albumin: 4 g/dL (ref 3.6–5.1)
BUN/Creatinine Ratio: 10 (calc) (ref 6–22)
BUN: 11 mg/dL (ref 7–25)
CO2: 29 mmol/L (ref 20–32)
Calcium: 10.4 mg/dL (ref 8.6–10.4)
Chloride: 105 mmol/L (ref 98–110)
Creat: 1.12 mg/dL — ABNORMAL HIGH (ref 0.50–1.05)
Glucose, Bld: 112 mg/dL — ABNORMAL HIGH (ref 65–99)
Phosphorus: 3.7 mg/dL (ref 2.5–4.5)
Potassium: 5 mmol/L (ref 3.5–5.3)
Sodium: 142 mmol/L (ref 135–146)

## 2023-07-22 LAB — VITAMIN D 25 HYDROXY (VIT D DEFICIENCY, FRACTURES): Vit D, 25-Hydroxy: 59 ng/mL (ref 30–100)

## 2023-07-22 LAB — PTH, INTACT AND CALCIUM
Calcium: 10.4 mg/dL (ref 8.6–10.4)
PTH: 83 pg/mL — ABNORMAL HIGH (ref 16–77)

## 2023-07-23 ENCOUNTER — Other Ambulatory Visit: Payer: Medicare HMO

## 2023-07-23 ENCOUNTER — Other Ambulatory Visit: Payer: Self-pay

## 2023-07-23 DIAGNOSIS — E213 Hyperparathyroidism, unspecified: Secondary | ICD-10-CM

## 2023-07-23 DIAGNOSIS — E215 Disorder of parathyroid gland, unspecified: Secondary | ICD-10-CM

## 2023-07-26 ENCOUNTER — Other Ambulatory Visit: Payer: Medicare HMO

## 2023-07-27 LAB — CALCIUM, 24-HOUR URINE WITH CREATININE
CALCIUM/CREATININE RATIO: 108 mg/g{creat} (ref 30–275)
Calcium, 24H Urine: 91 mg/(24.h)
Creatinine, 24H Ur: 0.85 g/(24.h) (ref 0.50–2.15)

## 2023-10-11 ENCOUNTER — Other Ambulatory Visit: Payer: Self-pay

## 2023-10-11 DIAGNOSIS — E215 Disorder of parathyroid gland, unspecified: Secondary | ICD-10-CM

## 2023-10-13 ENCOUNTER — Other Ambulatory Visit: Payer: Medicare Other

## 2023-10-14 LAB — VITAMIN D 25 HYDROXY (VIT D DEFICIENCY, FRACTURES): Vit D, 25-Hydroxy: 57 ng/mL (ref 30–100)

## 2023-10-14 LAB — RENAL FUNCTION PANEL
Albumin: 3.8 g/dL (ref 3.6–5.1)
BUN/Creatinine Ratio: 14 (calc) (ref 6–22)
BUN: 15 mg/dL (ref 7–25)
CO2: 23 mmol/L (ref 20–32)
Calcium: 9.5 mg/dL (ref 8.6–10.4)
Chloride: 104 mmol/L (ref 98–110)
Creat: 1.08 mg/dL — ABNORMAL HIGH (ref 0.50–1.05)
Glucose, Bld: 235 mg/dL — ABNORMAL HIGH (ref 65–99)
Phosphorus: 3.4 mg/dL (ref 2.5–4.5)
Potassium: 3.9 mmol/L (ref 3.5–5.3)
Sodium: 138 mmol/L (ref 135–146)

## 2023-10-14 LAB — MAGNESIUM: Magnesium: 1.9 mg/dL (ref 1.5–2.5)

## 2023-10-14 LAB — PTH, INTACT AND CALCIUM
Calcium: 9.5 mg/dL (ref 8.6–10.4)
PTH: 93 pg/mL — ABNORMAL HIGH (ref 16–77)

## 2023-10-20 ENCOUNTER — Ambulatory Visit: Payer: Medicare HMO | Admitting: "Endocrinology

## 2023-10-20 ENCOUNTER — Encounter: Payer: Self-pay | Admitting: "Endocrinology

## 2023-10-20 VITALS — BP 120/70 | HR 89 | Ht 64.75 in | Wt 254.6 lb

## 2023-10-20 DIAGNOSIS — E213 Hyperparathyroidism, unspecified: Secondary | ICD-10-CM | POA: Diagnosis not present

## 2023-10-20 NOTE — Progress Notes (Signed)
 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 GFR for 01/2023 labs done by another provider Patient has history significant for gastric bypass and takes calcium citrate 500 mg twice a day with vitamin D 5000 units every day  Patient has had history of bilateral kidney stones at the same time in 2021 06/2023 1, 25 vitamin D WNL at 43 07/2023 24 hr Ur Ca 91 10/13/23: Baseline labs show high PTH at 93 (GFR 60 with PCP lab) Pending bone density results to be shared by patient, ordered previously but patient did not do it saying she had it done in recent time Continue to monitor clinically  Return in about 6 months (around 04/21/2024) for visit + labs before next visit.   I have reviewed current medications, nurse's notes, allergies, vital signs, past medical and surgical history, family medical history, and social history for this encounter. Counseled patient on symptoms, examination findings, lab findings, imaging results, treatment decisions and monitoring and prognosis. The patient understood the recommendations and agrees with the treatment plan. All questions regarding treatment plan were fully answered.  Altamese [place], MD  10/20/23   History of Present Illness HPI   Amy Joseph is a 63 y.o. female referred by Dr. Thompson Grayer for evaluation and management of hypercalcemia.    Takes calcium citrate 500 mg bid and Vit D. 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  Patient has a history of kidney stones x 1 episode B/L kidneys  about in 2021. She  current hematuria No polyuria Yes nocturia Yes thirst Yes renal failure No anorexia Yes abdominal pain No heartburn No constipation No nausea or vomiting No history of peptic ulcer disease No depression Yes confusion No excessive fatigue No fracture No osteoporosis No headaches No numbness No tingling No  She takes Calcium Yes She takes Vitamin D supplements Yes  She a history of taking chronic lithium No She a recent history of thiazide diuretic intake No   She family history of renal stones/hypercalcemia No a personal history of MEN syndromes/medullary thyroid cancer/ pheochromocytoma No  01/2023 Alb 3.9 Ca 9.5 GFR 69 PTH 126.7 (81 in 10/2022)  Physical Exam  BP 120/70   Pulse 89   Ht 5' 4.75" (1.645 m)   Wt 254 lb 9.6 oz (115.5 kg)   SpO2 96%   BMI 42.70 kg/m    Constitutional: well developed, well nourished Head: normocephalic, atraumatic Eyes: sclera anicteric, no redness Neck: supple Lungs: normal respiratory effort Neurology: alert and oriented Skin: dry, no appreciable rashes Musculoskeletal: no appreciable defects Psychiatric: normal mood and affect   Current Medications Patient's Medications  New Prescriptions   No medications on file  Previous Medications   ALBUTEROL (PROVENTIL HFA;VENTOLIN HFA) 108 (90 BASE) MCG/ACT INHALER    Inhale 1-2 puffs into the lungs every 6 (six) hours as needed for wheezing or shortness of breath.   ASPIRIN EC 81 MG TABLET    Take 81 mg by mouth daily. Swallow whole.  BIOTIN PO    Take by mouth.   BISOPROLOL-HYDROCHLOROTHIAZIDE (ZIAC) 5-6.25 MG TABLET    Take 1 tablet by mouth daily.   CALCIUM CITRATE-VITAMIN D (CALCIUM + D PO)    Take by mouth.   CONTINUOUS BLOOD GLUC SENSOR (DEXCOM G7 SENSOR) MISC       EMPAGLIFLOZIN (JARDIANCE) 25 MG TABS TABLET    Take by mouth daily.   ERGOCALCIFEROL (VITAMIN D2) 1.25 MG (50000 UT) CAPSULE    Take by mouth.   GARLIC 2000 MG TBEC    Take by mouth.    GLIPIZIDE (GLUCOTROL) 5 MG TABLET    Take by mouth daily before breakfast.   IRON-VIT C-VIT B12-FOLIC ACID (IRON 100 PLUS PO)       LORATADINE (CLARITIN) 10 MG TABLET    Take by mouth.   LOSARTAN (COZAAR) 100 MG TABLET    Take 100 mg by mouth daily.   MONTELUKAST (SINGULAIR) 10 MG TABLET    Take 10 mg by mouth at bedtime.   SEMAGLUTIDE, 2 MG/DOSE, (OZEMPIC, 2 MG/DOSE,) 8 MG/3ML SOPN    Inject into the skin.   SERTRALINE (ZOLOFT) 50 MG TABLET    Take 50 mg by mouth daily.   VITAMIN B-12 (CYANOCOBALAMIN) 1000 MCG TABLET    Take 1,000 mcg by mouth daily.   VITAMINS A & D (VITAMIN A & D PO)    Take 1 tablet by mouth daily.  Modified Medications   No medications on file  Discontinued Medications   DULAGLUTIDE 1.5 MG/0.5ML SOPN    Inject into the skin once a week.   EMPAGLIFLOZIN (JARDIANCE) 10 MG TABS TABLET    Take 10 mg by mouth daily.   ESCITALOPRAM (LEXAPRO) 20 MG TABLET       HALOPERIDOL (HALDOL) 0.5 MG TABLET    Take 0.5 mg by mouth at bedtime.   METFORMIN (GLUCOPHAGE) 1000 MG TABLET    Take 1,000 mg by mouth 2 (two) times daily with a meal.    Allergies Allergies  Allergen Reactions   Sulfa Antibiotics Shortness Of Breath and Rash   Bee Venom Hives and Swelling   Codeine Itching and Rash   Penicillins Itching and Rash    Has patient had a PCN reaction causing immediate rash, facial/tongue/throat swelling, SOB or lightheadedness with hypotension: Yes Has patient had a PCN reaction causing severe rash involving mucus membranes or skin necrosis: No Has patient had a PCN reaction that required hospitalization No Has patient had a PCN reaction occurring within the last 10 years: No If all of the above answers are "NO", then may proceed with Cephalosporin use.    Percocet [Oxycodone-Acetaminophen] Itching and Rash    Past Medical History Past Medical History:  Diagnosis Date   Diabetes mellitus without complication (HCC)    Family history of breast cancer 05/16/2021   Hypertension      Past Surgical History Past Surgical History:  Procedure Laterality Date   REPLACEMENT TOTAL KNEE     TEE WITHOUT CARDIOVERSION N/A 03/09/2016   Procedure: TRANSESOPHAGEAL ECHOCARDIOGRAM (TEE);  Surgeon: Pricilla Riffle, MD;  Location: [facility] ENDOSCOPY;  Service: Cardiovascular;  Laterality: N/A;   TUBAL LIGATION      Family History family history includes Breast cancer in her mother; Cancer in her maternal grandmother; Diabetes in her brother; Glaucoma in her father; Hypertension in her father and mother; Kidney disease in her brother.  Social History Social History   Socioeconomic History   Marital status: Single    Spouse name:  Not on file   Number of children: Not on file   Years of education: Not on file   Highest education level: Not on file  Occupational History   Not on file  Tobacco Use   Smoking status: Never    Passive exposure: Past   Smokeless tobacco: Never  Vaping Use   Vaping status: Never Used  Substance and Sexual Activity   Alcohol use: Yes    Comment: occ   Drug use: No   Sexual activity: Not Currently    Birth control/protection: Post-menopausal    Comment: 1st intercourse- 17, partners: <10  Other Topics Concern   Not on file  Social History Narrative   Not on file   Social Drivers of Health   Financial Resource Strain: Not on file  Food Insecurity: Not on file  Transportation Needs: Not on file  Physical Activity: Not on file  Stress: Not on file  Social Connections: Not on file  Intimate Partner Violence: Not At Risk (07/01/2022)   Received from [facility], [facility]   Interpersonal Violence    Interpersonal Violence: No    Lab Results  Component Value Date   CHOL 134 03/08/2016   Lab Results  Component Value Date   HDL 43 03/08/2016   Lab Results  Component Value Date   LDLCALC 76 03/08/2016   Lab Results  Component Value Date   TRIG 77 03/08/2016   Lab Results  Component Value Date   CHOLHDL 3.1  03/08/2016   Lab Results  Component Value Date   CREATININE 1.08 (H) 10/13/2023   No results found for: "GFR"    Component Value Date/Time   NA 138 10/13/2023 1350   K 3.9 10/13/2023 1350   CL 104 10/13/2023 1350   CO2 23 10/13/2023 1350   GLUCOSE 235 (H) 10/13/2023 1350   BUN 15 10/13/2023 1350   CREATININE 1.08 (H) 10/13/2023 1350   CALCIUM 9.5 10/13/2023 1350   CALCIUM 9.5 10/13/2023 1350   PROT 6.9 03/19/2022 1511   ALBUMIN 3.9 03/19/2022 1511   AST 28 03/19/2022 1511   ALT 39 03/19/2022 1511   ALKPHOS 89 03/19/2022 1511   BILITOT 0.6 03/19/2022 1511   GFRNONAA 59 (L) 03/19/2022 1511   GFRAA >60 03/10/2016 1119      Latest Ref Rng & Units 10/13/2023    1:50 PM 07/21/2023    3:09 PM 03/19/2022    3:11 PM  BMP  Glucose 65 - 99 mg/dL 540  981  97   BUN 7 - 25 mg/dL 15  11  17    Creatinine 0.50 - 1.05 mg/dL 1.91  4.78  2.95   BUN/Creat Ratio 6 - 22 (calc) 14  10    Sodium 135 - 146 mmol/L 138  142  140   Potassium 3.5 - 5.3 mmol/L 3.9  5.0  4.2   Chloride 98 - 110 mmol/L 104  105  104   CO2 20 - 32 mmol/L 23  29  29    Calcium 8.6 - 10.4 mg/dL 8.6 - 62.1 mg/dL 9.5    9.5  30.8    65.7  9.5        Component Value Date/Time   WBC 5.5 03/19/2022 1511   RBC 4.82 03/19/2022 1511   HGB 13.5 03/19/2022 1511   HCT 42.4 03/19/2022 1511   PLT 319 03/19/2022 1511   MCV 88.0 03/19/2022 1511   MCH 28.0 03/19/2022 1511   MCHC 31.8 03/19/2022 1511  RDW 15.5 03/19/2022 1511   LYMPHSABS 2.2 03/19/2022 1511   MONOABS 0.4 03/19/2022 1511   EOSABS 0.2 03/19/2022 1511   BASOSABS 0.0 03/19/2022 1511   No results found for: "TSH", "FREET4"       Parts of this note may have been dictated using voice recognition software. There may be variances in spelling and vocabulary which are unintentional. Not all errors are proofread. Please notify the Thereasa Parkin if any discrepancies are noted or if the meaning of any statement is not clear.

## 2023-10-22 ENCOUNTER — Encounter: Payer: Self-pay | Admitting: "Endocrinology

## 2024-04-03 ENCOUNTER — Other Ambulatory Visit: Payer: Self-pay

## 2024-04-13 ENCOUNTER — Other Ambulatory Visit

## 2024-04-17 LAB — PTH, INTACT AND CALCIUM
Calcium: 10.2 mg/dL (ref 8.6–10.4)
PTH: 49 pg/mL (ref 16–77)

## 2024-04-17 LAB — RENAL FUNCTION PANEL
Albumin: 4.1 g/dL (ref 3.6–5.1)
BUN: 10 mg/dL (ref 7–25)
CO2: 28 mmol/L (ref 20–32)
Calcium: 10.2 mg/dL (ref 8.6–10.4)
Chloride: 104 mmol/L (ref 98–110)
Creat: 0.98 mg/dL (ref 0.50–1.05)
Glucose, Bld: 145 mg/dL — ABNORMAL HIGH (ref 65–99)
Phosphorus: 3.8 mg/dL (ref 2.5–4.5)
Potassium: 3.9 mmol/L (ref 3.5–5.3)
Sodium: 140 mmol/L (ref 135–146)

## 2024-04-17 LAB — VITAMIN D 1,25 DIHYDROXY
Vitamin D 1, 25 (OH)2 Total: 38 pg/mL (ref 18–72)
Vitamin D2 1, 25 (OH)2: <8 pg/mL
Vitamin D3 1, 25 (OH)2: 38 pg/mL

## 2024-04-17 LAB — VITAMIN D 25 HYDROXY (VIT D DEFICIENCY, FRACTURES): Vit D, 25-Hydroxy: 57 ng/mL (ref 30–100)

## 2024-04-20 ENCOUNTER — Encounter: Payer: Self-pay | Admitting: "Endocrinology

## 2024-04-20 ENCOUNTER — Ambulatory Visit: Admitting: "Endocrinology

## 2024-04-20 VITALS — BP 110/80 | HR 85 | Ht 64.0 in | Wt 246.0 lb

## 2024-04-20 DIAGNOSIS — Z7984 Long term (current) use of oral hypoglycemic drugs: Secondary | ICD-10-CM

## 2024-04-20 DIAGNOSIS — Z8639 Personal history of other endocrine, nutritional and metabolic disease: Secondary | ICD-10-CM

## 2024-04-20 DIAGNOSIS — E119 Type 2 diabetes mellitus without complications: Secondary | ICD-10-CM

## 2024-04-20 DIAGNOSIS — Z7985 Long-term (current) use of injectable non-insulin antidiabetic drugs: Secondary | ICD-10-CM

## 2024-04-20 NOTE — Progress Notes (Signed)
 Outpatient Endocrinology Note Amy Birmingham, MD    [facility] [birth year] [id number]  Referring Provider: Kahoano, Haku K, MD Primary Care Provider: Kahoano, Haku K, MD Reason for consultation: Subjective   Assessment & Plan  Diagnoses and all orders for this visit:  History of hyperparathyroidism  Diabetes mellitus type II, non insulin  dependent (HCC)  Long term (current) use of oral hypoglycemic drugs  Long-term (current) use of injectable non-insulin  antidiabetic drugs   Hyperparathyroidism in the absence of low GFR for 01/2023 labs done by another provider Patient has history significant for gastric bypass and takes calcium  citrate 500 mg twice a day with vitamin D  5000 units every day  Patient has had history of bilateral kidney stones at the same time in 2021 06/2023 1, 25 vitamin D  WNL at 43 07/2023 24 hr Ur Ca 91 10/13/23: Baseline labs show high PTH at 93 (GFR 60 with PCP lab) Pending bone density results to be shared by patient, ordered previously but patient did not do it saying she had it done in recent time 07/2023 Left femoral neck -0.5 T-score, normal BMD 04/13/24: alls labs WNL: PTH, Ca, Vit D Continue to monitor clinically  Last A1C 6.6% on 03/15/24 On Jardiance 25 mg qd, glipizide 2.5 mg qd, ozempic 2 mg /week and metformin 1000mg  bid Recommend cutting down calories and walking after mels to prevent post-prandial spikes  Return in about 1 year (around 04/20/2025).   I have reviewed current medications, nurse's notes, allergies, vital signs, past medical and surgical history, family medical history, and social history for this encounter. Counseled patient on symptoms, examination findings, lab findings, imaging results, treatment decisions and monitoring and prognosis. The patient understood the recommendations and agrees with the treatment plan. All questions regarding treatment plan were fully answered.  Amy Birmingham, MD  04/20/24   History  of Present Illness HPI   Amy Joseph is a 63 y.o. female referred by Dr. Kahoano for management of hypercalcemia.    Denies constipation/GERD?abdominal pain/nausea/vomiting  Taking both calcium  and Vit D   Initial history:  Takes calcium  citrate 500 mg bid and Vit D. 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  Patient has a history of kidney stones x 1 episode B/L kidneys about in 2021. She  current hematuria No polyuria Yes nocturia Yes thirst Yes renal failure No anorexia Yes abdominal pain No heartburn No constipation No nausea or vomiting No history of peptic ulcer disease No depression Yes confusion No excessive fatigue No fracture No osteoporosis No headaches No numbness No tingling No  She takes Calcium  Yes She takes Vitamin D  supplements Yes  She a history of taking chronic lithium No She a recent history of thiazide diuretic intake No   She family history of renal stones/hypercalcemia No a personal history of MEN syndromes/medullary thyroid cancer/ pheochromocytoma No  01/2023 Alb 3.9 Ca 9.5 GFR 69 PTH 126.7 (81 in 10/2022)  Physical Exam  BP 110/80   Pulse 85   Ht 5' 4 (1.626 m)   Wt 246 lb (111.6 kg)   SpO2 96%   BMI 42.23 kg/m    Constitutional: well developed, well nourished Head: normocephalic, atraumatic Eyes: sclera anicteric, no redness Neck: supple Lungs: normal respiratory effort Neurology: alert and oriented Skin: dry, no appreciable rashes Musculoskeletal: no appreciable defects Psychiatric: normal mood and affect   Current Medications Patient's Medications  New Prescriptions   No medications on file  Previous Medications   ALBUTEROL (PROVENTIL HFA;VENTOLIN HFA) 108 (  90 BASE) MCG/ACT INHALER    Inhale 1-2 puffs into the lungs every 6 (six) hours as needed for wheezing or shortness of breath.   ASPIRIN  EC 81 MG TABLET    Take 81 mg by mouth daily. Swallow whole.   BIOTIN PO    Take by mouth.    BISOPROLOL-HYDROCHLOROTHIAZIDE (ZIAC) 5-6.25 MG TABLET    Take 1 tablet by mouth daily.   CALCIUM  CITRATE-VITAMIN D  (CALCIUM  + D PO)    Take by mouth.   CONTINUOUS BLOOD GLUC SENSOR (DEXCOM G7 SENSOR) MISC       EMPAGLIFLOZIN (JARDIANCE) 25 MG TABS TABLET    Take by mouth daily.   ERGOCALCIFEROL  (VITAMIN D2) 1.25 MG (50000 UT) CAPSULE    Take by mouth.   GARLIC 2000 MG TBEC    Take by mouth.   GLIPIZIDE (GLUCOTROL) 5 MG TABLET    Take by mouth daily before breakfast.   IRON-VIT C-VIT B12-FOLIC ACID (IRON 100 PLUS PO)       LORATADINE (CLARITIN) 10 MG TABLET    Take by mouth.   LOSARTAN (COZAAR) 100 MG TABLET    Take 100 mg by mouth daily.   MONTELUKAST (SINGULAIR) 10 MG TABLET    Take 10 mg by mouth at bedtime.   RESMETIROM (REZDIFFRA) 100 MG TABS    Take 100 mg by mouth.   SEMAGLUTIDE, 2 MG/DOSE, (OZEMPIC, 2 MG/DOSE,) 8 MG/3ML SOPN    Inject into the skin.   SERTRALINE (ZOLOFT) 50 MG TABLET    Take 50 mg by mouth daily.   VITAMIN B-12 (CYANOCOBALAMIN ) 1000 MCG TABLET    Take 1,000 mcg by mouth daily.   VITAMINS A & D (VITAMIN A  & D PO)    Take 1 tablet by mouth daily.  Modified Medications   No medications on file  Discontinued Medications   No medications on file    Allergies Allergies  Allergen Reactions   Sulfa Antibiotics Shortness Of Breath and Rash   Bee Venom Hives and Swelling   Codeine Itching and Rash   Penicillins Itching and Rash    Has patient had a PCN reaction causing immediate rash, facial/tongue/throat swelling, SOB or lightheadedness with hypotension: Yes Has patient had a PCN reaction causing severe rash involving mucus membranes or skin necrosis: No Has patient had a PCN reaction that required hospitalization No Has patient had a PCN reaction occurring within the last 10 years: No If all of the above answers are NO, then may proceed with Cephalosporin use.    Percocet [Oxycodone-Acetaminophen ] Itching and Rash    Past Medical History Past Medical History:   Diagnosis Date   Diabetes mellitus without complication (HCC)    Family history of breast cancer 05/16/2021   Hypertension     Past Surgical History Past Surgical History:  Procedure Laterality Date   REPLACEMENT TOTAL KNEE     TEE WITHOUT CARDIOVERSION N/A 03/09/2016   Procedure: TRANSESOPHAGEAL ECHOCARDIOGRAM (TEE);  Surgeon: Vina LULLA Gull, MD;  Location: [facility] ENDOSCOPY;  Service: Cardiovascular;  Laterality: N/A;   TUBAL LIGATION      Family History family history includes Breast cancer in her mother; Cancer in her maternal grandmother; Diabetes in her brother; Glaucoma in her father; Hypertension in her father and mother; Kidney disease in her brother.  Social History Social History   Socioeconomic History   Marital status: Single    Spouse name: Not on file   Number of children: Not on file   Years of education: Not  on file   Highest education level: Not on file  Occupational History   Not on file  Tobacco Use   Smoking status: Never    Passive exposure: Past   Smokeless tobacco: Never  Vaping Use   Vaping status: Never Used  Substance and Sexual Activity   Alcohol use: Yes    Comment: occ   Drug use: No   Sexual activity: Not Currently    Birth control/protection: Post-menopausal    Comment: 1st intercourse- 17, partners: <10  Other Topics Concern   Not on file  Social History Narrative   Not on file   Social Drivers of Health   Financial Resource Strain: Not on file  Food Insecurity: Low Risk  (11/15/2023)   Received from [facility]   Hunger Vital Sign    Within the past 12 months, you worried that your food would run out before you got money to buy more: Never true    Within the past 12 months, the food you bought just didn't last and you didn't have money to get more. : Never true  Transportation Needs: No Transportation Needs (11/15/2023)   Received from [facility]   Transportation    In the past 12 months, has lack of reliable transportation kept  you from medical appointments, meetings, work or from getting things needed for daily living? : No  Physical Activity: Not on file  Stress: Not on file  Social Connections: Not on file  Intimate Partner Violence: Not At Risk (07/01/2022)   Received from [facility]   Interpersonal Violence    Interpersonal Violence: No    Lab Results  Component Value Date   CHOL 134 03/08/2016   Lab Results  Component Value Date   HDL 43 03/08/2016   Lab Results  Component Value Date   LDLCALC 76 03/08/2016   Lab Results  Component Value Date   TRIG 77 03/08/2016   Lab Results  Component Value Date   CHOLHDL 3.1 03/08/2016   Lab Results  Component Value Date   CREATININE 0.98 04/13/2024   No results found for: GFR    Component Value Date/Time   NA 140 04/13/2024 0928   K 3.9 04/13/2024 0928   CL 104 04/13/2024 0928   CO2 28 04/13/2024 0928   GLUCOSE 145 (H) 04/13/2024 0928   BUN 10 04/13/2024 0928   CREATININE 0.98 04/13/2024 0928   CALCIUM  10.2 04/13/2024 0928   CALCIUM  10.2 04/13/2024 0928   PROT 6.9 03/19/2022 1511   ALBUMIN 3.9 03/19/2022 1511   AST 28 03/19/2022 1511   ALT 39 03/19/2022 1511   ALKPHOS 89 03/19/2022 1511   BILITOT 0.6 03/19/2022 1511   GFRNONAA 59 (L) 03/19/2022 1511   GFRAA >60 03/10/2016 1119      Latest Ref Rng & Units 04/13/2024    9:28 AM 10/13/2023    1:50 PM 07/21/2023    3:09 PM  BMP  Glucose 65 - 99 mg/dL 854  764  887   BUN 7 - 25 mg/dL 10  15  11    Creatinine 0.50 - 1.05 mg/dL 9.01  8.91  8.87   BUN/Creat Ratio 6 - 22 (calc) SEE NOTE:  14  10   Sodium 135 - 146 mmol/L 140  138  142   Potassium 3.5 - 5.3 mmol/L 3.9  3.9  5.0   Chloride 98 - 110 mmol/L 104  104  105   CO2 20 - 32 mmol/L 28  23  29  Calcium  8.6 - 10.4 mg/dL 8.6 - 89.5 mg/dL 89.7    89.7  9.5    9.5  10.4    10.4        Component Value Date/Time   WBC 5.5 03/19/2022 1511   RBC 4.82 03/19/2022 1511   HGB 13.5 03/19/2022 1511   HCT 42.4 03/19/2022  1511   PLT 319 03/19/2022 1511   MCV 88.0 03/19/2022 1511   MCH 28.0 03/19/2022 1511   MCHC 31.8 03/19/2022 1511   RDW 15.5 03/19/2022 1511   LYMPHSABS 2.2 03/19/2022 1511   MONOABS 0.4 03/19/2022 1511   EOSABS 0.2 03/19/2022 1511   BASOSABS 0.0 03/19/2022 1511   No results found for: TSH, FREET4       Parts of this note may have been dictated using voice recognition software. There may be variances in spelling and vocabulary which are unintentional. Not all errors are proofread. Please notify the dino if any discrepancies are noted or if the meaning of any statement is not clear.

## 2024-06-12 ENCOUNTER — Ambulatory Visit (INDEPENDENT_AMBULATORY_CARE_PROVIDER_SITE_OTHER)

## 2024-06-12 ENCOUNTER — Encounter: Payer: Self-pay | Admitting: Podiatry

## 2024-06-12 ENCOUNTER — Ambulatory Visit: Admitting: Podiatry

## 2024-06-12 VITALS — Ht 64.0 in | Wt 246.0 lb

## 2024-06-12 DIAGNOSIS — Z0189 Encounter for other specified special examinations: Secondary | ICD-10-CM | POA: Diagnosis not present

## 2024-06-12 DIAGNOSIS — E119 Type 2 diabetes mellitus without complications: Secondary | ICD-10-CM

## 2024-06-12 DIAGNOSIS — M7751 Other enthesopathy of right foot: Secondary | ICD-10-CM | POA: Diagnosis not present

## 2024-06-12 DIAGNOSIS — M7752 Other enthesopathy of left foot: Secondary | ICD-10-CM | POA: Diagnosis not present

## 2024-06-12 DIAGNOSIS — G5762 Lesion of plantar nerve, left lower limb: Secondary | ICD-10-CM | POA: Diagnosis not present

## 2024-06-12 MED ORDER — MELOXICAM 15 MG PO TABS: 15.0000 mg | ORAL_TABLET | Freq: Every day | ORAL | 1 refills | Status: DC

## 2024-06-19 DIAGNOSIS — G5762 Lesion of plantar nerve, left lower limb: Secondary | ICD-10-CM | POA: Diagnosis not present

## 2024-06-19 MED ORDER — BETAMETHASONE SOD PHOS & ACET 6 (3-3) MG/ML IJ SUSP
3.0000 mg | Freq: Once | INTRAMUSCULAR | Status: AC
  Administered 2024-06-19: 3 mg via INTRA_ARTICULAR

## 2024-06-19 NOTE — Progress Notes (Signed)
   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 female PMHx T2DM without complication presenting today for annual diabetic foot exam.  She has also been experiencing significant tenderness to the left forefoot for about 4 months now.  Past Medical History:  Diagnosis Date   Diabetes mellitus without complication (HCC)    Family history of breast cancer 05/16/2021   Hypertension     Past Surgical History:  Procedure Laterality Date   REPLACEMENT TOTAL KNEE     TEE WITHOUT CARDIOVERSION N/A 03/09/2016   Procedure: TRANSESOPHAGEAL ECHOCARDIOGRAM (TEE);  Surgeon: Vina LULLA Gull, MD;  Location: [facility] ENDOSCOPY;  Service: Cardiovascular;  Laterality: N/A;   TUBAL LIGATION      Allergies  Allergen Reactions   Sulfa Antibiotics Shortness Of Breath and Rash   Bee Venom Hives and Swelling   Codeine Itching and Rash   Penicillins Itching and Rash    Has patient had a PCN reaction causing immediate rash, facial/tongue/throat swelling, SOB or lightheadedness with hypotension: Yes Has patient had a PCN reaction causing severe rash involving mucus membranes or skin necrosis: No Has patient had a PCN reaction that required hospitalization No Has patient had a PCN reaction occurring within the last 10 years: No If all of the above answers are NO, then may proceed with Cephalosporin use.    Percocet [Oxycodone-Acetaminophen ] Itching and Rash     Physical Exam: General: The patient is alert and oriented x3 in no acute distress.  Dermatology: Skin is warm, dry and supple bilateral lower extremities.   Vascular: Palpable pedal pulses bilaterally. Capillary refill within normal limits.  No appreciable edema.  No erythema.  Neurological: Grossly intact via light touch  Musculoskeletal Exam: No pedal deformities noted.  Tenderness to palpation noted to  the third intermetatarsal space of the left foot consistent with a Morton's neuroma/neuritis  Assessment/Plan of Care: 1.  Diabetes mellitus; uncomplicated 2.  Encounter for diabetic foot exam 3.  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  Comprehensive diabetic foot exam performed today -Continue management with PCP for diabetes control -Advised against going barefoot.  Recommend good supportive shoes and sneakers -Injection of 0.5 cc Celestone Soluspan injected into the third intermetatarsal space left foot -Prescription for meloxicam 15 mg daily -Return to clinic annually       Thresa EMERSON Sar, DPM [facility]  Dr. Thresa EMERSON Sar, DPM    [address] [place], [place] [address]                Office [phone]  Fax [phone]

## 2024-06-21 ENCOUNTER — Ambulatory Visit: Payer: Medicare HMO | Admitting: Podiatry

## 2024-06-27 ENCOUNTER — Telehealth: Payer: Self-pay | Admitting: Podiatry

## 2024-06-27 NOTE — Telephone Encounter (Signed)
 Called to clarify if patient needs both appointments.There is a November appt and December appt

## 2024-06-28 ENCOUNTER — Ambulatory Visit: Admitting: Podiatry

## 2024-07-19 ENCOUNTER — Ambulatory Visit: Admitting: Podiatry

## 2024-08-14 ENCOUNTER — Other Ambulatory Visit: Payer: Self-pay | Admitting: Lab

## 2024-08-14 MED ORDER — MELOXICAM 15 MG PO TABS: 15.0000 mg | ORAL_TABLET | Freq: Every day | ORAL | 1 refills | Status: AC

## 2025-01-29 ENCOUNTER — Ambulatory Visit: Admitting: Family Medicine

## 2025-04-19 ENCOUNTER — Ambulatory Visit: Admitting: "Endocrinology
